# Patient Record
Sex: Male | Born: 1975 | Race: White | Hispanic: No | Marital: Single | Smoking: Former smoker
Health system: Southern US, Community
[De-identification: ages and names within clinical notes are randomized; demographics above are authoritative.]

## PROBLEM LIST (undated history)

## (undated) DIAGNOSIS — J3489 Other specified disorders of nose and nasal sinuses: Secondary | ICD-10-CM

## (undated) DIAGNOSIS — F419 Anxiety disorder, unspecified: Secondary | ICD-10-CM

## (undated) HISTORY — PX: KNEE SURGERY: SHX244

## (undated) HISTORY — PX: FINGER SURGERY: SHX640

---

## 1998-05-05 ENCOUNTER — Encounter: Payer: Self-pay | Admitting: Internal Medicine

## 1998-05-05 ENCOUNTER — Emergency Department (HOSPITAL_COMMUNITY): Admission: EM | Admit: 1998-05-05 | Discharge: 1998-05-05 | Payer: Self-pay | Admitting: Internal Medicine

## 1998-05-12 ENCOUNTER — Emergency Department (HOSPITAL_COMMUNITY): Admission: EM | Admit: 1998-05-12 | Discharge: 1998-05-12 | Payer: Self-pay | Admitting: Emergency Medicine

## 1998-07-27 ENCOUNTER — Emergency Department (HOSPITAL_COMMUNITY): Admission: EM | Admit: 1998-07-27 | Discharge: 1998-07-27 | Payer: Self-pay | Admitting: Emergency Medicine

## 1998-07-27 ENCOUNTER — Encounter: Payer: Self-pay | Admitting: Emergency Medicine

## 1998-08-16 ENCOUNTER — Emergency Department (HOSPITAL_COMMUNITY): Admission: EM | Admit: 1998-08-16 | Discharge: 1998-08-16 | Payer: Self-pay | Admitting: Internal Medicine

## 1998-09-25 ENCOUNTER — Emergency Department (HOSPITAL_COMMUNITY): Admission: EM | Admit: 1998-09-25 | Discharge: 1998-09-25 | Payer: Self-pay | Admitting: Emergency Medicine

## 1998-11-04 ENCOUNTER — Encounter: Payer: Self-pay | Admitting: Emergency Medicine

## 1998-11-04 ENCOUNTER — Emergency Department (HOSPITAL_COMMUNITY): Admission: EM | Admit: 1998-11-04 | Discharge: 1998-11-04 | Payer: Self-pay | Admitting: Emergency Medicine

## 1999-03-19 ENCOUNTER — Emergency Department (HOSPITAL_COMMUNITY): Admission: EM | Admit: 1999-03-19 | Discharge: 1999-03-19 | Payer: Self-pay | Admitting: Emergency Medicine

## 1999-04-15 ENCOUNTER — Emergency Department (HOSPITAL_COMMUNITY): Admission: EM | Admit: 1999-04-15 | Discharge: 1999-04-15 | Payer: Self-pay | Admitting: Emergency Medicine

## 1999-04-15 ENCOUNTER — Encounter: Payer: Self-pay | Admitting: Emergency Medicine

## 2001-10-18 ENCOUNTER — Emergency Department (HOSPITAL_COMMUNITY): Admission: EM | Admit: 2001-10-18 | Discharge: 2001-10-18 | Payer: Self-pay | Admitting: Emergency Medicine

## 2001-12-15 ENCOUNTER — Emergency Department (HOSPITAL_COMMUNITY): Admission: EM | Admit: 2001-12-15 | Discharge: 2001-12-16 | Payer: Self-pay | Admitting: *Deleted

## 2002-08-19 ENCOUNTER — Emergency Department (HOSPITAL_COMMUNITY): Admission: EM | Admit: 2002-08-19 | Discharge: 2002-08-19 | Payer: Self-pay | Admitting: Emergency Medicine

## 2003-01-12 ENCOUNTER — Emergency Department (HOSPITAL_COMMUNITY): Admission: AC | Admit: 2003-01-12 | Discharge: 2003-01-13 | Payer: Self-pay

## 2003-01-20 ENCOUNTER — Emergency Department (HOSPITAL_COMMUNITY): Admission: EM | Admit: 2003-01-20 | Discharge: 2003-01-20 | Payer: Self-pay | Admitting: Emergency Medicine

## 2004-08-13 ENCOUNTER — Emergency Department (HOSPITAL_COMMUNITY): Admission: EM | Admit: 2004-08-13 | Discharge: 2004-08-13 | Payer: Self-pay | Admitting: Emergency Medicine

## 2007-12-23 ENCOUNTER — Emergency Department (HOSPITAL_COMMUNITY): Admission: EM | Admit: 2007-12-23 | Discharge: 2007-12-23 | Payer: Self-pay | Admitting: Emergency Medicine

## 2008-02-16 ENCOUNTER — Emergency Department (HOSPITAL_COMMUNITY): Admission: EM | Admit: 2008-02-16 | Discharge: 2008-02-16 | Payer: Self-pay | Admitting: Emergency Medicine

## 2008-09-26 ENCOUNTER — Emergency Department (HOSPITAL_COMMUNITY): Admission: EM | Admit: 2008-09-26 | Discharge: 2008-09-26 | Payer: Self-pay | Admitting: Emergency Medicine

## 2008-11-28 ENCOUNTER — Emergency Department (HOSPITAL_COMMUNITY): Admission: EM | Admit: 2008-11-28 | Discharge: 2008-11-28 | Payer: Self-pay | Admitting: Emergency Medicine

## 2008-12-16 ENCOUNTER — Emergency Department (HOSPITAL_COMMUNITY): Admission: EM | Admit: 2008-12-16 | Discharge: 2008-12-16 | Payer: Self-pay | Admitting: Emergency Medicine

## 2009-07-06 ENCOUNTER — Emergency Department (HOSPITAL_BASED_OUTPATIENT_CLINIC_OR_DEPARTMENT_OTHER): Admission: EM | Admit: 2009-07-06 | Discharge: 2009-07-06 | Payer: Self-pay | Admitting: Emergency Medicine

## 2010-10-24 LAB — ETHANOL: Alcohol, Ethyl (B): 189 mg/dL — ABNORMAL HIGH (ref 0–10)

## 2010-10-24 LAB — CBC
MCHC: 33.9 g/dL (ref 30.0–36.0)
MCV: 91.3 fL (ref 78.0–100.0)
Platelets: 164 10*3/uL (ref 150–400)
RDW: 13.6 % (ref 11.5–15.5)
WBC: 12.8 10*3/uL — ABNORMAL HIGH (ref 4.0–10.5)

## 2010-10-24 LAB — DIFFERENTIAL
Basophils Absolute: 0 10*3/uL (ref 0.0–0.1)
Basophils Relative: 0 % (ref 0–1)
Eosinophils Absolute: 0 10*3/uL (ref 0.0–0.7)
Eosinophils Relative: 0 % (ref 0–5)
Lymphs Abs: 1.2 10*3/uL (ref 0.7–4.0)
Neutrophils Relative %: 84 % — ABNORMAL HIGH (ref 43–77)

## 2010-10-24 LAB — BASIC METABOLIC PANEL
BUN: 4 mg/dL — ABNORMAL LOW (ref 6–23)
Chloride: 111 mEq/L (ref 96–112)
Creatinine, Ser: 0.79 mg/dL (ref 0.4–1.5)
Glucose, Bld: 100 mg/dL — ABNORMAL HIGH (ref 70–99)
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
## 2011-03-04 ENCOUNTER — Emergency Department (HOSPITAL_COMMUNITY)
Admission: EM | Admit: 2011-03-04 | Discharge: 2011-03-04 | Disposition: A | Discharge status: Home / Self Care | Payer: Self-pay | Attending: Emergency Medicine | Admitting: Emergency Medicine

## 2011-03-04 ENCOUNTER — Emergency Department (HOSPITAL_COMMUNITY): Payer: Self-pay

## 2011-03-04 DIAGNOSIS — S81009A Unspecified open wound, unspecified knee, initial encounter: Secondary | ICD-10-CM | POA: Insufficient documentation

## 2011-03-04 DIAGNOSIS — IMO0002 Reserved for concepts with insufficient information to code with codable children: Secondary | ICD-10-CM | POA: Insufficient documentation

## 2011-03-04 DIAGNOSIS — M79609 Pain in unspecified limb: Secondary | ICD-10-CM | POA: Insufficient documentation

## 2011-03-04 DIAGNOSIS — S0180XA Unspecified open wound of other part of head, initial encounter: Secondary | ICD-10-CM | POA: Insufficient documentation

## 2011-09-30 ENCOUNTER — Emergency Department (INDEPENDENT_AMBULATORY_CARE_PROVIDER_SITE_OTHER): Payer: BC Managed Care – PPO

## 2011-09-30 ENCOUNTER — Encounter (HOSPITAL_BASED_OUTPATIENT_CLINIC_OR_DEPARTMENT_OTHER): Payer: Self-pay | Admitting: Emergency Medicine

## 2011-09-30 ENCOUNTER — Emergency Department (HOSPITAL_BASED_OUTPATIENT_CLINIC_OR_DEPARTMENT_OTHER)
Admission: EM | Admit: 2011-09-30 | Discharge: 2011-09-30 | Disposition: A | Discharge status: Home / Self Care | Payer: BC Managed Care – PPO | Attending: Emergency Medicine | Admitting: Emergency Medicine

## 2011-09-30 DIAGNOSIS — W208XXA Other cause of strike by thrown, projected or falling object, initial encounter: Secondary | ICD-10-CM | POA: Insufficient documentation

## 2011-09-30 DIAGNOSIS — M25539 Pain in unspecified wrist: Secondary | ICD-10-CM | POA: Insufficient documentation

## 2011-09-30 DIAGNOSIS — M7989 Other specified soft tissue disorders: Secondary | ICD-10-CM | POA: Insufficient documentation

## 2011-09-30 DIAGNOSIS — R209 Unspecified disturbances of skin sensation: Secondary | ICD-10-CM | POA: Insufficient documentation

## 2011-09-30 DIAGNOSIS — X58XXXA Exposure to other specified factors, initial encounter: Secondary | ICD-10-CM

## 2011-09-30 DIAGNOSIS — M79609 Pain in unspecified limb: Secondary | ICD-10-CM | POA: Insufficient documentation

## 2011-09-30 DIAGNOSIS — R609 Edema, unspecified: Secondary | ICD-10-CM | POA: Insufficient documentation

## 2011-09-30 DIAGNOSIS — IMO0002 Reserved for concepts with insufficient information to code with codable children: Secondary | ICD-10-CM | POA: Insufficient documentation

## 2011-09-30 DIAGNOSIS — S6990XA Unspecified injury of unspecified wrist, hand and finger(s), initial encounter: Secondary | ICD-10-CM | POA: Insufficient documentation

## 2011-09-30 DIAGNOSIS — S60229A Contusion of unspecified hand, initial encounter: Secondary | ICD-10-CM | POA: Insufficient documentation

## 2011-09-30 DIAGNOSIS — T148XXA Other injury of unspecified body region, initial encounter: Secondary | ICD-10-CM

## 2011-09-30 DIAGNOSIS — M25449 Effusion, unspecified hand: Secondary | ICD-10-CM | POA: Insufficient documentation

## 2011-09-30 HISTORY — DX: Anxiety disorder, unspecified: F41.9

## 2011-09-30 LAB — DIFFERENTIAL
Basophils Absolute: 0 10*3/uL (ref 0.0–0.1)
Basophils Relative: 0 % (ref 0–1)
Eosinophils Absolute: 0.2 K/uL (ref 0.0–0.7)
Eosinophils Relative: 3 % (ref 0–5)
Lymphocytes Relative: 30 % (ref 12–46)
Lymphs Abs: 2.2 10*3/uL (ref 0.7–4.0)
Monocytes Absolute: 0.7 K/uL (ref 0.1–1.0)
Monocytes Relative: 10 % (ref 3–12)
Neutro Abs: 4.2 10*3/uL (ref 1.7–7.7)
Neutrophils Relative %: 57 % (ref 43–77)

## 2011-09-30 LAB — CBC
HCT: 44.5 % (ref 39.0–52.0)
Hemoglobin: 15.6 g/dL (ref 13.0–17.0)
MCH: 30.8 pg (ref 26.0–34.0)
MCHC: 35.1 g/dL (ref 30.0–36.0)
MCV: 87.8 fL (ref 78.0–100.0)
Platelets: 188 10*3/uL (ref 150–400)
RBC: 5.07 MIL/uL (ref 4.22–5.81)
RDW: 13.2 % (ref 11.5–15.5)
WBC: 7.3 10*3/uL (ref 4.0–10.5)

## 2011-09-30 LAB — SEDIMENTATION RATE: Sed Rate: 0 mm/hr (ref 0–16)

## 2011-09-30 LAB — URIC ACID: Uric Acid, Serum: 5.7 mg/dL (ref 4.0–7.8)

## 2011-09-30 MED ORDER — METHYLPREDNISOLONE 4 MG PO KIT: PACK | ORAL | Status: AC

## 2011-09-30 MED ORDER — OXYCODONE HCL 5 MG PO CAPS: 5.0000 mg | ORAL_CAPSULE | ORAL | Status: AC | PRN

## 2011-09-30 NOTE — ED Notes (Signed)
Dr. Amanda Pea here to see pt.

## 2011-09-30 NOTE — ED Provider Notes (Signed)
History     CSN: [id number]  Arrival date & time 09/30/11  1025   First MD Initiated Contact with Patient 09/30/11 1105      Chief Complaint  Patient presents with  . Hand Injury    (Consider location/radiation/quality/duration/timing/severity/associated sxs/prior treatment) HPI Comments: Patient reports 2 or 3 days ago, he was trying to move his daughter is wooden bed by himself and he slipped to the ground and when he did portion of the bed fell onto the dorsum of his left hand. He reports pain was present at the time but he was able to move his hand and was not particularly swollen at the time and actually was able to try and go to work. He reports no no skin that seem immediate that at that time without any new bleeding, abrasions or lacerations. Subsequently the color of his hand has become more red and has become much more tense and swollen involving his entire hand and now spreading throughout his wrist and into his forearm. He is right-hand dominant. He denies any fevers or chills. He was seen by his primary care physician yesterday who apparently did plain films in the office, reported no fractures and was placed on Percocet for pain. He was instructed to keep his hand elevated. The patient has also been taking ibuprofen for symptoms. He also now complains of numbness to the tips of all of his fingers. He is able to move his fingers and his wrist somewhat but is limited to to the swelling which does increase his pain when he tries to flex his hand into a fist. He denies pain into his elbow or shoulder.  Patient is a 37 y.o. male presenting with hand injury. The history is provided by the patient.  Hand Injury  Pertinent negatives include no fever.    Past Medical History  Diagnosis Date  . Anxiety     History reviewed. No pertinent past surgical history.  History reviewed. No pertinent family history.  History  Substance Use Topics  . Smoking status: Never Smoker   .  Smokeless tobacco: Not on file  . Alcohol Use: Yes      Review of Systems  Constitutional: Negative.  Negative for fever and chills.  Musculoskeletal: Positive for joint swelling and arthralgias.  Skin: Positive for color change and wound. Negative for rash.  Neurological: Positive for numbness. Negative for weakness.    Allergies  Review of patient's allergies indicates no known allergies.  Home Medications   Current Outpatient Rx  Name Route Sig Dispense Refill  . OXYCODONE-ACETAMINOPHEN 10-325 MG PO TABS Oral Take 1 tablet by mouth every 4 (four) hours as needed.    . METHYLPREDNISOLONE 4 MG PO KIT  follow package directions 21 tablet 0  . OXYCODONE HCL 5 MG PO CAPS Oral Take 1 capsule (5 mg total) by mouth every 4 (four) hours as needed. 30 capsule 0    BP 131/80  Pulse 56  Temp(Src) 98 F (36.7 C) (Oral)  Resp 18  SpO2 99%  Physical Exam  Nursing note and vitals reviewed. Constitutional: He appears well-developed and well-nourished.  HENT:  Head: Normocephalic.  Musculoskeletal:       Diffuse redness and swelling involving primarily the dorsum of his left hand that extends into his wrist and forearm. There is pitting edema present but is very firm to touch. He is able to flex and extend his fingers and thumb but is unable to make a complete fist due to the  degree of swelling. There is evidence of healing old abrasions to the dorsum of his hand, but he reports that these or not recent and not associated with his recent trauma. His hand is not hot to touch and feels the same temperature as his right hand. His fingertips he reports he is able to feel pressure sensation but not painful stimuli. 2+ radial pulses palpable in the left upper extremity. Cap refill is approximately 2 seconds on his fingertips.  Neurological: He is alert. He has normal strength. GCS eye subscore is 4. GCS verbal subscore is 5. GCS motor subscore is 6.  Skin: Skin is warm.    ED Course    Procedures (including critical care time)   Labs Reviewed  CBC  DIFFERENTIAL  SEDIMENTATION RATE  URIC ACID   Dg Wrist Complete Left  09/30/2011  *RADIOLOGY REPORT*  Clinical Data: Blunt trauma  LEFT WRIST - COMPLETE 3+ VIEW  Comparison: Hand films 02/16/2008  Findings: There is a remote ulnar styloid fracture.  There is no acute fracture of distal radius or ulna.  The radiocarpal joint is normal.  Carpal bones are normal.  IMPRESSION: No left wrist fracture.  Original Report Authenticated By: Genevive Bi, M.D.   Dg Hand Complete Left  09/30/2011  *RADIOLOGY REPORT*  Clinical Data: Blunt trauma to hand  LEFT HAND - COMPLETE 3+ VIEW  Comparison: None.  Findings: There is a well corticated ossific fragment adjacent to the ulnar styloid which likely represents remote fracture.  No acute fracture of distal radius or ulna.  The radiocarpal joint appears normal.  There is significant soft tissue swelling over dorsum of the hand.  No radiodense foreign body.  IMPRESSION:  1.  No evidence acute fracture. 2.  Significant soft tissue swelling of the dorsum of the hand.  3. Remote ulnar styloid fracture.  Original Report Authenticated By: Genevive Bi, M.D.     1. Contusion     2:22 PM  Pt seen by Dr. Amanda Pea who thinks deep contusion.  Pt is splinted, told to keep elevated, can follow up with Dr. Amanda Pea as outpt.  Thinks tingling is from a carpal tunnel pressure difference from swelling.    MDM   Patient's examination suggests either traumatic injury and swelling versus infection. However he does not have a fever and his skin is not hot to touch. I discussed my findings with hand surgeon on call, Dr. Amanda Pea, who has graciously come here to the emergency department to evaluate the patient. At his request, a CBC, uric acid, sedimentation rate and plain films were ordered. These thus far appeared to be unremarkable. I did review the plain films myself. No acute fracture is noted. Soft tissue  swelling was evident which is also evident on clinical exam.        Gavin Pound. Claudine Stallings, MD 09/30/11 1422

## 2011-09-30 NOTE — Discharge Instructions (Signed)
Elevate and move your left hand/fingers frequently  Please do not engage in physical activity  Call Dr. Amanda Pea  for any problems. If your symptoms worsen please return to motion immediately and asked for your Dr.

## 2011-09-30 NOTE — ED Notes (Signed)
Pt. C.o of pain and swelling in left hand. Stated he dropped bed on it 2 days ago and went to primary MD and it wasn't broken. Hand it hot to touch and swollen upon assessment.

## 2011-09-30 NOTE — Consult Note (Signed)
Reason for Consult:Left arm pain and swelling Referring Physician: [name] is an 36 y.o. male.  HPI: Patient is a pleasant male 36 years of age who complains of left arm swelling after an injury Thursday. He had a bed fall on his left hand Thursday. He was seen by his regular physician Friday and a conservative algorithm was instituted.. There was no specific recommendations except for avoid ice and perform elevation. He presents today as he is having continued swelling and mild pain. He denies prior history of infection into the hand. He states he does have some tingling to the tips of the fingers which is developed.  He has no warmth or cellulitic findings to the arm. 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 His main issue is the contusive injury to his hand with subsequent swelling that he perceives.  Admittedly he has not elevated it as well as he should.  He denies other injury or exposure.   Past Medical History  Diagnosis Date  . Anxiety     History reviewed. No pertinent past surgical history.  History reviewed. No pertinent family history.  Social History:  reports that he has never smoked. He does not have any smokeless tobacco history on file. He reports that he drinks alcohol. He reports that he does not use illicit drugs.  Allergies: No Known Allergies  Medications: I have reviewed the patient's current medications.  Results for orders placed during the hospital encounter of 09/30/11 (from the past 48 hour(s))  CBC     Status: Normal   Collection Time   09/30/11 12:12 PM      Component Value Range Comment   WBC 7.3  4.0 - 10.5 (K/uL)    RBC 5.07  4.22 - 5.81 (MIL/uL)    Hemoglobin 15.6  13.0 - 17.0 (g/dL)    HCT 16.1  09.6 - 04.5 (%)    MCV 87.8  78.0 - 100.0 (fL)    MCH 30.8  26.0 - 34.0 (pg)    MCHC 35.1  30.0 - 36.0 (g/dL)    RDW 40.9  81.1 - 91.4 (%)    Platelets 188  150 - 400 (K/uL)   DIFFERENTIAL     Status: Normal   Collection Time   09/30/11 12:12 PM      Component Value Range Comment   Neutrophils Relative 57  43 - 77 (%)    Neutro Abs 4.2  1.7 - 7.7 (K/uL)    Lymphocytes Relative 30  12 - 46 (%)    Lymphs Abs 2.2  0.7 - 4.0 (K/uL)    Monocytes Relative 10  3 - 12 (%)    Monocytes Absolute 0.7  0.1 - 1.0 (K/uL)    Eosinophils Relative 3  0 - 5 (%)    Eosinophils Absolute 0.2  0.0 - 0.7 (K/uL)    Basophils Relative 0  0 - 1 (%)    Basophils Absolute 0.0  0.0 - 0.1 (K/uL)   URIC ACID     Status: Normal   Collection Time   09/30/11 12:12 PM      Component Value Range Comment   Uric Acid, Serum 5.7  4.0 - 7.8 (mg/dL)     Dg Wrist Complete Left  09/30/2011  *RADIOLOGY REPORT*  Clinical Data: Blunt trauma  LEFT WRIST - COMPLETE 3+ VIEW  Comparison: Hand films 02/16/2008  Findings: There  is a remote ulnar styloid fracture.  There is no acute fracture of distal radius or ulna.  The radiocarpal joint is normal.  Carpal bones are normal.  IMPRESSION: No left wrist fracture.  Original Report Authenticated By: Genevive Bi, M.D.   Dg Hand Complete Left  09/30/2011  *RADIOLOGY REPORT*  Clinical Data: Blunt trauma to hand  LEFT HAND - COMPLETE 3+ VIEW  Comparison: None.  Findings: There is a well corticated ossific fragment adjacent to the ulnar styloid which likely represents remote fracture.  No acute fracture of distal radius or ulna.  The radiocarpal joint appears normal.  There is significant soft tissue swelling over dorsum of the hand.  No radiodense foreign body.  IMPRESSION:  1.  No evidence acute fracture. 2.  Significant soft tissue swelling of the dorsum of the hand.  3. Remote ulnar styloid fracture.  Original Report Authenticated By: Genevive Bi, M.D.    Review of Systems  Constitutional: Negative.   HENT: Negative.   Eyes: Negative.   Respiratory: Negative.   Cardiovascular: Negative.   Gastrointestinal: Negative.     Genitourinary: Negative.   Musculoskeletal:       He has contusive injury to the left upper extremity and swelling  Skin:       Contusive injury to left hand and distal forearm noted with ecchymosis.  Neurological: Negative.   Endo/Heme/Allergies: Negative.   Psychiatric/Behavioral: Negative.    There were no vitals taken for this visit. Physical Exam he has a normal HEENT exam. Abdomen is nontender lower extremity examination is benign without signs of infection dystrophy vascular compromise or DVT. Patient has a normal right upper extremity with multiple tattooed markings. Patient has a left upper extremity examination which shows swelling over the hand and wrist including the distal forearm. The swelling is generalized and his compartments are all soft. He has good refill to the hand. He has good radial artery pulse. He is nontender to passive extension of the fingers. There is no evidence of compartment syndrome. There is no evidence of DVT. There is no evidence of obvious bony injury. There is no evidence of instability although the swelling makes stress testing of the wrist difficult. He does complain of some sensory disturbance in the hand at the tips of his fingers.  He does not have any advance signs of vascular or bony derangement. He did not have any signs of infection as there is no heat or warmth to the area. He certainly does not have any signs of flexor tenosynovitis in the fingers suspect he has some swelling in his carpal canal given the swelling and the slight decreased sensation to the tips of his fingers. The patient I reviewed this at length. His chest is clear his neck and back are nontender.  I reviewed all radiographs which are negative. I reviewed these personally.  Assessment/Plan: Contusive injury to the wrist and distal forearm after a bed fell on his hand. There is no evidence of advanced compartment syndrome but there is certainly swelling in the hand which makes it  uncomfortable for the patient.  I do not see any signs of infection compartment syndrome or advanced vascular derangement. I would recommend that we elevate splint and immobilized extremity. Unfortunately he has not been elevating the extremity well and this is going to be absent a necessary. I would keep a very close eye on his sensation. If he worsens I would consider nerve studies and carpal tunnel release if necessary. He understands this. Oftentimes  it is difficult to discern a evolving compression syndrome to the nerve versus a contusive injury to the nerve. I have discussed this with the patient at length and he understands these issues. I am going to place him on a Medrol Dosepak I have also splinted him with a short arm splint and discussed with him the continued elevation and range of motion measures  It is a pleasure to see him today we also have oxycodone written for pain and he understands that I will want to check him in 24 hours to make sure that he is not worsening. It is very clear to him at this point in time how important elevation and backing off his activities are per his recovery  Was a pleasure to see him today  Karen Chafe 09/30/2011, 1:50 PM

## 2011-09-30 NOTE — Discharge Summary (Signed)
  Please see physician notes/consult

## 2012-07-23 ENCOUNTER — Emergency Department (HOSPITAL_BASED_OUTPATIENT_CLINIC_OR_DEPARTMENT_OTHER): Payer: Self-pay

## 2012-07-23 ENCOUNTER — Emergency Department (HOSPITAL_BASED_OUTPATIENT_CLINIC_OR_DEPARTMENT_OTHER)
Admission: EM | Admit: 2012-07-23 | Discharge: 2012-07-23 | Disposition: A | Discharge status: Home / Self Care | Payer: Self-pay | Attending: Emergency Medicine | Admitting: Emergency Medicine

## 2012-07-23 ENCOUNTER — Encounter (HOSPITAL_BASED_OUTPATIENT_CLINIC_OR_DEPARTMENT_OTHER): Payer: Self-pay | Admitting: *Deleted

## 2012-07-23 DIAGNOSIS — M25449 Effusion, unspecified hand: Secondary | ICD-10-CM | POA: Insufficient documentation

## 2012-07-23 DIAGNOSIS — IMO0001 Reserved for inherently not codable concepts without codable children: Secondary | ICD-10-CM | POA: Insufficient documentation

## 2012-07-23 DIAGNOSIS — W208XXA Other cause of strike by thrown, projected or falling object, initial encounter: Secondary | ICD-10-CM | POA: Insufficient documentation

## 2012-07-23 DIAGNOSIS — Y9269 Other specified industrial and construction area as the place of occurrence of the external cause: Secondary | ICD-10-CM | POA: Insufficient documentation

## 2012-07-23 DIAGNOSIS — Z87891 Personal history of nicotine dependence: Secondary | ICD-10-CM | POA: Insufficient documentation

## 2012-07-23 DIAGNOSIS — Z79899 Other long term (current) drug therapy: Secondary | ICD-10-CM | POA: Insufficient documentation

## 2012-07-23 DIAGNOSIS — F411 Generalized anxiety disorder: Secondary | ICD-10-CM | POA: Insufficient documentation

## 2012-07-23 DIAGNOSIS — M25442 Effusion, left hand: Secondary | ICD-10-CM

## 2012-07-23 DIAGNOSIS — Y990 Civilian activity done for income or pay: Secondary | ICD-10-CM | POA: Insufficient documentation

## 2012-07-23 DIAGNOSIS — S6990XA Unspecified injury of unspecified wrist, hand and finger(s), initial encounter: Secondary | ICD-10-CM | POA: Insufficient documentation

## 2012-07-23 DIAGNOSIS — Z87828 Personal history of other (healed) physical injury and trauma: Secondary | ICD-10-CM | POA: Insufficient documentation

## 2012-07-23 MED ORDER — HYDROCODONE-ACETAMINOPHEN 5-325 MG PO TABS: 2.0000 | ORAL_TABLET | ORAL | Status: DC | PRN

## 2012-07-23 NOTE — ED Notes (Addendum)
States a part fell on his left hand at work this am. Hand is red and swollen. Denies IV injection into his left  hand.

## 2012-07-23 NOTE — ED Provider Notes (Signed)
Medical screening examination/treatment/procedure(s) were performed by non-physician practitioner and as supervising physician I was immediately available for consultation/collaboration.  Ethelda Chick, MD 07/23/12 2152

## 2012-07-23 NOTE — ED Provider Notes (Signed)
History     CSN: [id number]  Arrival date & time 07/23/12  1740   First MD Initiated Contact with Patient 07/23/12 1848      Chief Complaint  Patient presents with  . Hand Injury    (Consider location/radiation/quality/duration/timing/severity/associated sxs/prior treatment) Patient is a 37 y.o. male presenting with hand injury. The history is provided by the patient. No language interpreter was used.  Hand Injury  The incident occurred 6 to 12 hours ago. The incident occurred at work. The injury mechanism was a direct blow. The pain is present in the left hand. The quality of the pain is described as aching. The pain is moderate. The pain has been constant since the incident. Pertinent negatives include no fever. He reports no foreign bodies present. The symptoms are aggravated by movement. He has tried nothing for the symptoms. The treatment provided no relief.  Pt reports he injured hand about 6 months ago and saw Dr. Amanda Pea,  No fracture.  Pt reports he had swelling that went down.  Pt reports today he dropped a part on hand and he heard a pop,  Now hand is swollen like before  Past Medical History  Diagnosis Date  . Anxiety     History reviewed. No pertinent past surgical history.  No family history on file.  History  Substance Use Topics  . Smoking status: Former Games developer  . Smokeless tobacco: Not on file  . Alcohol Use: Yes      Review of Systems  Constitutional: Negative for fever.  Musculoskeletal: Positive for myalgias and joint swelling.  All other systems reviewed and are negative.    Allergies  Review of patient's allergies indicates no known allergies.  Home Medications   Current Outpatient Rx  Name  Route  Sig  Dispense  Refill  . ALPRAZOLAM PO   Oral   Take by mouth.         . OXYCODONE-ACETAMINOPHEN 10-325 MG PO TABS   Oral   Take 1 tablet by mouth every 4 (four) hours as needed.           BP 140/77  Pulse 78  Temp 98.5 F (36.9 C)  (Oral)  Resp 20  SpO2 100%  Physical Exam  Nursing note and vitals reviewed. Constitutional: He is oriented to person, place, and time. He appears well-developed and well-nourished.  Musculoskeletal: He exhibits tenderness.       Swollen left hand,  From,   nv and ns intact,  No erythema,  Same temp to palp bilat,  nv and ns intact  Neurological: He is alert and oriented to person, place, and time. He has normal reflexes.  Psychiatric: He has a normal mood and affect.    ED Course  Procedures (including critical care time)  Labs Reviewed - No data to display Dg Hand Complete Left  07/23/2012  *RADIOLOGY REPORT*  Clinical Data: Hand injury.  Hand pain.  Swelling and tenderness.  LEFT HAND - COMPLETE 3+ VIEW  Comparison: 09/30/2011.  Findings: Anatomic alignment of the bones of the right hand.  Old ulnar styloid avulsion fracture.  There is no acute fracture identified.  Soft tissue swelling is present over the dorsum of the hand which appears similar to 09/30/2011.  Soft tissue swelling extends over the dorsum of the wrist and distal forearm.  IMPRESSION: Diffuse dorsal soft tissue swelling without osseous injury.   Original Report Authenticated By: Andreas Newport, M.D.      1. Swelling of joint of left  hand       MDM  I counseled pt.  I will treat pain,  Pt placed in ace and sling,   I advised elevate,  Call Dr. Amanda Pea to be seen.   I suspect ligamentous injury        Elson Areas, PA 07/23/12 2150  Lonia Skinner [place], [place] 07/23/12 2150

## 2013-03-05 ENCOUNTER — Emergency Department (HOSPITAL_BASED_OUTPATIENT_CLINIC_OR_DEPARTMENT_OTHER)
Admission: EM | Admit: 2013-03-05 | Discharge: 2013-03-05 | Disposition: A | Discharge status: Home / Self Care | Payer: Self-pay | Attending: Emergency Medicine | Admitting: Emergency Medicine

## 2013-03-05 ENCOUNTER — Encounter (HOSPITAL_BASED_OUTPATIENT_CLINIC_OR_DEPARTMENT_OTHER): Payer: Self-pay

## 2013-03-05 DIAGNOSIS — R51 Headache: Secondary | ICD-10-CM | POA: Insufficient documentation

## 2013-03-05 DIAGNOSIS — Z87891 Personal history of nicotine dependence: Secondary | ICD-10-CM | POA: Insufficient documentation

## 2013-03-05 DIAGNOSIS — R059 Cough, unspecified: Secondary | ICD-10-CM | POA: Insufficient documentation

## 2013-03-05 DIAGNOSIS — R05 Cough: Secondary | ICD-10-CM | POA: Insufficient documentation

## 2013-03-05 DIAGNOSIS — J329 Chronic sinusitis, unspecified: Secondary | ICD-10-CM | POA: Insufficient documentation

## 2013-03-05 DIAGNOSIS — F411 Generalized anxiety disorder: Secondary | ICD-10-CM | POA: Insufficient documentation

## 2013-03-05 DIAGNOSIS — Z79899 Other long term (current) drug therapy: Secondary | ICD-10-CM | POA: Insufficient documentation

## 2013-03-05 MED ORDER — AMOXICILLIN 500 MG PO CAPS: 500.0000 mg | ORAL_CAPSULE | Freq: Three times a day (TID) | ORAL | Status: DC

## 2013-03-05 NOTE — ED Notes (Signed)
Pt reports head and nasal congestion x 1 week.  Denies fever or other symptoms.

## 2013-03-05 NOTE — ED Provider Notes (Signed)
  CSN: [id number]     Arrival date & time 03/05/13  1209 History     First MD Initiated Contact with Patient 03/05/13 1218     Chief Complaint  Patient presents with  . Nasal Congestion  . Facial Pain   (Consider location/radiation/quality/duration/timing/severity/associated sxs/prior Treatment) Patient is a 37 y.o. male presenting with cough. The history is provided by the patient. No language interpreter was used.  Cough Cough characteristics:  Non-productive Severity:  Moderate Timing:  Constant Progression:  Worsening Chronicity:  New Relieved by:  Nothing Worsened by:  Nothing tried Associated symptoms: rhinorrhea     Past Medical History  Diagnosis Date  . Anxiety    History reviewed. No pertinent past surgical history. No family history on file. History  Substance Use Topics  . Smoking status: Former Games developer  . Smokeless tobacco: Not on file  . Alcohol Use: Yes    Review of Systems  HENT: Positive for congestion, rhinorrhea and sinus pressure.   Respiratory: Positive for cough.   All other systems reviewed and are negative.    Allergies  Review of patient's allergies indicates no known allergies.  Home Medications   Current Outpatient Rx  Name  Route  Sig  Dispense  Refill  . ALPRAZOLAM PO   Oral   Take by mouth.         Marland Kitchen HYDROcodone-acetaminophen (NORCO/VICODIN) 5-325 MG per tablet   Oral   Take 2 tablets by mouth every 4 (four) hours as needed for pain.   10 tablet   0    BP 135/82  Pulse 99  Temp(Src) 98.4 F (36.9 C) (Oral)  Resp 16  Ht 5\' 11"  (1.803 m)  Wt 155 lb (70.308 kg)  BMI 21.63 kg/m2  SpO2 99% Physical Exam  Nursing note and vitals reviewed. Constitutional: He is oriented to person, place, and time. He appears well-developed and well-nourished.  HENT:  Head: Normocephalic.  Right Ear: External ear normal.  Left Ear: External ear normal.  Nose: Nose normal.  Mouth/Throat: Oropharynx is clear and moist.  Eyes:  Conjunctivae are normal. Pupils are equal, round, and reactive to light.  Neck: Normal range of motion. Neck supple.  Cardiovascular: Normal rate.   Pulmonary/Chest: Effort normal.  Abdominal: Soft.  Musculoskeletal: Normal range of motion.  Neurological: He is alert and oriented to person, place, and time.  Skin: Skin is warm.  Psychiatric: He has a normal mood and affect.    ED Course   Procedures (including critical care time)  Labs Reviewed - No data to display No results found. 1. Sinusitis     MDM    Elson Areas, PA-C 03/05/13 1245

## 2013-03-05 NOTE — ED Notes (Signed)
Pt reports facial pain and head congestion x 1 week.

## 2013-03-06 NOTE — ED Provider Notes (Signed)
Medical screening examination/treatment/procedure(s) were performed by non-physician practitioner and as supervising physician I was immediately available for consultation/collaboration.   Audree Camel, MD 03/06/13 [phone]

## 2013-04-27 ENCOUNTER — Emergency Department (HOSPITAL_BASED_OUTPATIENT_CLINIC_OR_DEPARTMENT_OTHER)
Admission: EM | Admit: 2013-04-27 | Discharge: 2013-04-27 | Disposition: A | Discharge status: Home / Self Care | Payer: 59 | Attending: Emergency Medicine | Admitting: Emergency Medicine

## 2013-04-27 ENCOUNTER — Encounter (HOSPITAL_BASED_OUTPATIENT_CLINIC_OR_DEPARTMENT_OTHER): Payer: Self-pay | Admitting: Emergency Medicine

## 2013-04-27 DIAGNOSIS — Z792 Long term (current) use of antibiotics: Secondary | ICD-10-CM | POA: Insufficient documentation

## 2013-04-27 DIAGNOSIS — W268XXA Contact with other sharp object(s), not elsewhere classified, initial encounter: Secondary | ICD-10-CM | POA: Insufficient documentation

## 2013-04-27 DIAGNOSIS — Y929 Unspecified place or not applicable: Secondary | ICD-10-CM | POA: Insufficient documentation

## 2013-04-27 DIAGNOSIS — F17200 Nicotine dependence, unspecified, uncomplicated: Secondary | ICD-10-CM | POA: Insufficient documentation

## 2013-04-27 DIAGNOSIS — S01309A Unspecified open wound of unspecified ear, initial encounter: Secondary | ICD-10-CM | POA: Insufficient documentation

## 2013-04-27 DIAGNOSIS — F411 Generalized anxiety disorder: Secondary | ICD-10-CM | POA: Insufficient documentation

## 2013-04-27 DIAGNOSIS — S01312A Laceration without foreign body of left ear, initial encounter: Secondary | ICD-10-CM

## 2013-04-27 DIAGNOSIS — Y9389 Activity, other specified: Secondary | ICD-10-CM | POA: Insufficient documentation

## 2013-04-27 DIAGNOSIS — Z79899 Other long term (current) drug therapy: Secondary | ICD-10-CM | POA: Insufficient documentation

## 2013-04-27 NOTE — ED Provider Notes (Signed)
CSN: [id number]     Arrival date & time 04/27/13  0014 History   First MD Initiated Contact with Patient 04/27/13 0107     Chief Complaint  Patient presents with  . Ear Injury   (Consider location/radiation/quality/duration/timing/severity/associated sxs/prior Treatment) Patient is a 37 y.o. male presenting with ear pain. The history is provided by the patient.  Otalgia Location:  Left Behind ear:  No abnormality (abnormality, ie tear of lower part of the ear lobe from trying to insert a "gauge") Quality:  Aching Severity:  Mild Onset quality:  Sudden Duration:  1 hour Timing:  Constant Progression:  Unchanged Chronicity:  New Context comment:  Tear Relieved by:  Nothing Worsened by:  Nothing tried Ineffective treatments:  None tried Associated symptoms: no abdominal pain   Risk factors: no recent travel     Past Medical History  Diagnosis Date  . Anxiety    History reviewed. No pertinent past surgical history. History reviewed. No pertinent family history. History  Substance Use Topics  . Smoking status: Current Every Day Smoker  . Smokeless tobacco: Not on file  . Alcohol Use: Yes    Review of Systems  HENT: Positive for ear pain.   Gastrointestinal: Negative for abdominal pain.  All other systems reviewed and are negative.    Allergies  Review of patient's allergies indicates no known allergies.  Home Medications   Current Outpatient Rx  Name  Route  Sig  Dispense  Refill  . ALPRAZOLAM PO   Oral   Take by mouth.         Marland Kitchen amoxicillin (AMOXIL) 500 MG capsule   Oral   Take 1 capsule (500 mg total) by mouth 3 (three) times daily.   30 capsule   0   . HYDROcodone-acetaminophen (NORCO/VICODIN) 5-325 MG per tablet   Oral   Take 2 tablets by mouth every 4 (four) hours as needed for pain.   10 tablet   0    BP 137/73  Pulse 58  Temp(Src) 97.8 F (36.6 C) (Oral)  Resp 16  Ht 5\' 10"  (1.778 m)  Wt 165 lb (74.844 kg)  BMI 23.68 kg/m2  SpO2  100% Physical Exam  Constitutional: He is oriented to person, place, and time. He appears well-developed and well-nourished.  HENT:  Head: Normocephalic and atraumatic.    Mouth/Throat: Oropharynx is clear and moist.  Tear of the lobule of the left ear   Eyes: Conjunctivae are normal. Pupils are equal, round, and reactive to light.  Neck: Normal range of motion. Neck supple.  Cardiovascular: Normal rate, regular rhythm and intact distal pulses.   Pulmonary/Chest: Effort normal and breath sounds normal. He has no wheezes. He has no rales.  Abdominal: Soft. Bowel sounds are normal. There is no tenderness.  Musculoskeletal: Normal range of motion.  Neurological: He is alert and oriented to person, place, and time.  Skin: Skin is warm and dry.  Psychiatric: He has a normal mood and affect.    ED Course  Procedures (including critical care time) Labs Review Labs Reviewed - No data to display Imaging Review No results found.  EKG Interpretation   None       MDM  No diagnosis found. Patient counseled that suturing may not be permanent due to necrosis of tissue caused by the "gauge" earring. EDP cannot suture shut hole  Patient advised he will definitely need to follow up with ear specialist for definitive care.  Patient and wife verbalized understanding and agree to  follow up    LACERATION REPAIR Performed by: Jasmine Awe Authorized by: Jasmine Awe Consent: Verbal consent obtained. Risks and benefits: risks, benefits and alternatives were discussed Consent given by: patient Patient identity confirmed: provided demographic data Prepped and Draped in normal sterile fashion Wound explored  Laceration Location: left ear lobe  Laceration Length: 0.2 cm No Foreign Bodies seen or palpated  Anesthesia:patient refused anesthesia   Irrigation method: syringe Amount of cleaning: standard  Skin closure: 4.0 vicryl   Number of sutures:2   Technique:  interrupted  Patient tolerance: Patient tolerated the procedure well with no immediate complications.   Jasmine Awe, MD 04/27/13 [phone]

## 2013-04-27 NOTE — ED Notes (Signed)
D/c home with family- no new rx given 

## 2013-04-27 NOTE — ED Notes (Addendum)
Patient was stretching his left ear and it ripped.

## 2013-04-27 NOTE — ED Notes (Signed)
MD at bedside suturing pt.

## 2013-10-24 ENCOUNTER — Emergency Department (HOSPITAL_BASED_OUTPATIENT_CLINIC_OR_DEPARTMENT_OTHER)
Admission: EM | Admit: 2013-10-24 | Discharge: 2013-10-24 | Disposition: A | Discharge status: Home / Self Care | Payer: 59 | Attending: Emergency Medicine | Admitting: Emergency Medicine

## 2013-10-24 ENCOUNTER — Encounter (HOSPITAL_BASED_OUTPATIENT_CLINIC_OR_DEPARTMENT_OTHER): Payer: Self-pay | Admitting: Emergency Medicine

## 2013-10-24 DIAGNOSIS — W268XXA Contact with other sharp object(s), not elsewhere classified, initial encounter: Secondary | ICD-10-CM | POA: Insufficient documentation

## 2013-10-24 DIAGNOSIS — R51 Headache: Secondary | ICD-10-CM | POA: Insufficient documentation

## 2013-10-24 DIAGNOSIS — S81819A Laceration without foreign body, unspecified lower leg, initial encounter: Secondary | ICD-10-CM

## 2013-10-24 DIAGNOSIS — Z792 Long term (current) use of antibiotics: Secondary | ICD-10-CM | POA: Insufficient documentation

## 2013-10-24 DIAGNOSIS — S81009A Unspecified open wound, unspecified knee, initial encounter: Secondary | ICD-10-CM | POA: Insufficient documentation

## 2013-10-24 DIAGNOSIS — Z87891 Personal history of nicotine dependence: Secondary | ICD-10-CM | POA: Insufficient documentation

## 2013-10-24 DIAGNOSIS — S91009A Unspecified open wound, unspecified ankle, initial encounter: Principal | ICD-10-CM

## 2013-10-24 DIAGNOSIS — Y929 Unspecified place or not applicable: Secondary | ICD-10-CM | POA: Insufficient documentation

## 2013-10-24 DIAGNOSIS — Y939 Activity, unspecified: Secondary | ICD-10-CM | POA: Insufficient documentation

## 2013-10-24 DIAGNOSIS — F411 Generalized anxiety disorder: Secondary | ICD-10-CM | POA: Insufficient documentation

## 2013-10-24 DIAGNOSIS — S81809A Unspecified open wound, unspecified lower leg, initial encounter: Principal | ICD-10-CM

## 2013-10-24 NOTE — ED Notes (Signed)
laceration to his right lower leg on a piece of sheet metal. Bleeding controlled.

## 2013-10-24 NOTE — ED Provider Notes (Signed)
CSN: [id number]     Arrival date & time 10/24/13  2056 History  This chart was scribed for Celene Kras, MD by Beverly Milch, ED Scribe. This patient was seen in room MH04/MH04 and the patient's care was started at 10:20 PM.    Chief Complaint  Patient presents with  . Extremity Laceration     The history is provided by the patient. No language interpreter was used.   HPI Comments: Frank Brady is a 38 y.o. male who presents to the Emergency Department complaining of laceration to his right lower leg on a piece of sheet metal today. Frank Brady states Frank Brady has an associated headache from the pain. Pt denies any numbness or weakness. Pt reports his last tetanus shot was 8 years ago. Frank Brady states Frank Brady would prefer not to have any numbing agent for staples.    Past Medical History  Diagnosis Date  . Anxiety     Past Surgical History  Procedure Laterality Date  . Knee surgery      No family history on file. History  Substance Use Topics  . Smoking status: Former Smoker    Quit date: 06/17/2013  . Smokeless tobacco: Not on file  . Alcohol Use: Yes    Review of Systems  Musculoskeletal:       Laceration to lower right leg  All other systems reviewed and are negative.     Allergies  Review of patient's allergies indicates no known allergies.  Home Medications   Current Outpatient Rx  Name  Route  Sig  Dispense  Refill  . ALPRAZOLAM PO   Oral   Take by mouth.         Marland Kitchen amoxicillin (AMOXIL) 500 MG capsule   Oral   Take 1 capsule (500 mg total) by mouth 3 (three) times daily.   30 capsule   0   . HYDROcodone-acetaminophen (NORCO/VICODIN) 5-325 MG per tablet   Oral   Take 2 tablets by mouth every 4 (four) hours as needed for pain.   10 tablet   0     Triage Vitals: BP 134/94  Pulse 80  Temp(Src) 98.3 F (36.8 C) (Oral)  Resp 20  Ht 6' (1.829 m)  Wt 165 lb (74.844 kg)  BMI 22.37 kg/m2  SpO2 100%   Physical Exam  Nursing note and vitals  reviewed. Constitutional: Frank Brady appears well-developed and well-nourished. No distress.  HENT:  Head: Normocephalic and atraumatic.  Right Ear: External ear normal.  Left Ear: External ear normal.  Eyes: Conjunctivae are normal. Right eye exhibits no discharge. Left eye exhibits no discharge. No scleral icterus.  Neck: Neck supple. No tracheal deviation present.  Cardiovascular: Normal rate.   Pulmonary/Chest: Effort normal. No stridor. No respiratory distress.  Musculoskeletal: Frank Brady exhibits no edema.       Right knee: Frank Brady exhibits laceration.       Legs: Neurological: Frank Brady is alert. Cranial nerve deficit: no gross deficits.  Skin: Skin is warm and dry. No rash noted.  Psychiatric: Frank Brady has a normal mood and affect.    ED Course  Procedures (including critical care time)  DIAGNOSTIC STUDIES: Oxygen Saturation is 100% on RA, normal by my interpretation.    COORDINATION OF CARE: 10:40 PM-  Pt advised of plan for treatment and pt agrees.  Pt does not want any anesthetic injections.  Frank Brady has had staples before.  LACERATION REPAIR Performed by: Celene Kras, MD Consent: Verbal consent obtained. Risks and benefits: risks, benefits  and alternatives were discussed Patient identity confirmed: provided demographic data Time out performed prior to procedure Prepped and Draped in normal sterile fashion Wound explored. Subcutaneous tissues no tendon and nerve involvement. Laceration Location: lateral aspect of right lower leg Laceration Length: 10 cm No Foreign Bodies seen or palpated Anesthesia: None used Local anesthetic: N/A Anesthetic total: N/A Irrigation method: syringe Amount of cleaning: standard Skin closure: staples   Number of sutures or staples: 18 Patient tolerance: Patient tolerated the procedure well with no immediate complications.   Final diagnoses:  Laceration of leg    I personally performed the services described in this documentation, which was scribed in my presence.   The recorded information has been reviewed and is accurate.    Celene KrasJon R Hannibal Skalla, MD 10/24/13 [phone]

## 2013-10-24 NOTE — Discharge Instructions (Signed)

## 2014-01-27 ENCOUNTER — Emergency Department (HOSPITAL_BASED_OUTPATIENT_CLINIC_OR_DEPARTMENT_OTHER): Payer: 59

## 2014-01-27 ENCOUNTER — Emergency Department (HOSPITAL_BASED_OUTPATIENT_CLINIC_OR_DEPARTMENT_OTHER)
Admission: EM | Admit: 2014-01-27 | Discharge: 2014-01-27 | Disposition: A | Discharge status: Home / Self Care | Payer: 59 | Attending: Emergency Medicine | Admitting: Emergency Medicine

## 2014-01-27 ENCOUNTER — Encounter (HOSPITAL_BASED_OUTPATIENT_CLINIC_OR_DEPARTMENT_OTHER): Payer: Self-pay | Admitting: Emergency Medicine

## 2014-01-27 DIAGNOSIS — Y9289 Other specified places as the place of occurrence of the external cause: Secondary | ICD-10-CM | POA: Insufficient documentation

## 2014-01-27 DIAGNOSIS — S59909A Unspecified injury of unspecified elbow, initial encounter: Secondary | ICD-10-CM | POA: Insufficient documentation

## 2014-01-27 DIAGNOSIS — X500XXA Overexertion from strenuous movement or load, initial encounter: Secondary | ICD-10-CM | POA: Insufficient documentation

## 2014-01-27 DIAGNOSIS — Y9389 Activity, other specified: Secondary | ICD-10-CM | POA: Insufficient documentation

## 2014-01-27 DIAGNOSIS — F411 Generalized anxiety disorder: Secondary | ICD-10-CM | POA: Insufficient documentation

## 2014-01-27 DIAGNOSIS — Z792 Long term (current) use of antibiotics: Secondary | ICD-10-CM | POA: Insufficient documentation

## 2014-01-27 DIAGNOSIS — M25532 Pain in left wrist: Secondary | ICD-10-CM

## 2014-01-27 DIAGNOSIS — M7989 Other specified soft tissue disorders: Secondary | ICD-10-CM

## 2014-01-27 DIAGNOSIS — Z87891 Personal history of nicotine dependence: Secondary | ICD-10-CM | POA: Insufficient documentation

## 2014-01-27 DIAGNOSIS — S59919A Unspecified injury of unspecified forearm, initial encounter: Principal | ICD-10-CM

## 2014-01-27 DIAGNOSIS — S6990XA Unspecified injury of unspecified wrist, hand and finger(s), initial encounter: Principal | ICD-10-CM

## 2014-01-27 MED ORDER — CLINDAMYCIN HCL 300 MG PO CAPS: 300.0000 mg | ORAL_CAPSULE | Freq: Four times a day (QID) | ORAL | Status: DC

## 2014-01-27 MED ORDER — TETANUS-DIPHTH-ACELL PERTUSSIS 5-2.5-18.5 LF-MCG/0.5 IM SUSP
0.5000 mL | Freq: Once | INTRAMUSCULAR | Status: DC
  Filled 2014-01-27: qty 0.5

## 2014-01-27 MED ORDER — OXYCODONE-ACETAMINOPHEN 5-325 MG PO TABS
2.0000 | ORAL_TABLET | Freq: Once | ORAL | Status: AC
  Administered 2014-01-27: 2 via ORAL
  Filled 2014-01-27: qty 2

## 2014-01-27 MED ORDER — HYDROCODONE-ACETAMINOPHEN 5-325 MG PO TABS: 1.0000 | ORAL_TABLET | Freq: Four times a day (QID) | ORAL | Status: DC | PRN

## 2014-01-27 MED ORDER — CLINDAMYCIN HCL 150 MG PO CAPS
300.0000 mg | ORAL_CAPSULE | Freq: Once | ORAL | Status: AC
  Administered 2014-01-27: 300 mg via ORAL
  Filled 2014-01-27: qty 2

## 2014-01-27 NOTE — ED Notes (Signed)
MD at bedside. 

## 2014-01-27 NOTE — ED Notes (Signed)
Patient transported to X-ray 

## 2014-01-27 NOTE — ED Provider Notes (Signed)
At discharge patient reported that Dr. Amanda PeaGramig has had to drain that hand in the past.  Patient cannot say why  Frank Brady K Aubery Douthat-Rasch, MD 01/27/14 [phone]

## 2014-01-27 NOTE — ED Notes (Signed)
Pt reports that he heard a pop in his left wrist and it started to swell immediately, previous problems with same wrist. Pt unsure what the cause of his other problems were from

## 2014-01-27 NOTE — ED Notes (Signed)
Left arm elevated on pillow on overbed table. Left hand higher than heart.  Ice pack applied to left hand.

## 2014-01-27 NOTE — ED Provider Notes (Addendum)
CSN: [id number]     Arrival date & time 01/27/14  0025 History  This chart was scribed for Efrat Zuidema Smitty CordsK Clearnce Leja-Rasch, MD by Quintella ReichertMatthew Underwood, ED scribe.  This patient was seen in room MH01/MH01 and the patient's care was started at 12:44 AM.   Chief Complaint  Patient presents with  . Wrist Injury    Patient is a 38 y.o. male presenting with wrist pain. The history is provided by the patient. No language interpreter was used.  Wrist Pain This is a new problem. The current episode started 1 to 2 hours ago. The problem occurs constantly. Progression since onset: Still present. Pertinent negatives include no headaches. Associated symptoms comments: Swelling. Exacerbated by: movement. Nothing relieves the symptoms. Treatments tried: ibuprofen. The treatment provided no relief.    HPI Comments: Frank Brady is a 38 y.o. male who presents to the Emergency Department complaining of sudden-onset left wrist pain that began 1 hour ago.  Pt states he was moving chairs out of a swimming pool when he felt a "pop" in his left wrist/ dorsum of the left hand and he immediately noticed the dorsum of L hand began to swell. Since then he has had constant moderate pain to the top of that wrist.  He has used ibuprofen.  He is not on blood-thinners and denies problems with bleeding or clotting.  When asked about the 2 scabs noted at the base of the thumb and hand patient states those are older and from work related injuries.     Past Medical History  Diagnosis Date  . Anxiety     Past Surgical History  Procedure Laterality Date  . Knee surgery      History reviewed. No pertinent family history.   History  Substance Use Topics  . Smoking status: Former Smoker    Quit date: 06/17/2013  . Smokeless tobacco: Not on file  . Alcohol Use: Yes     Review of Systems  Constitutional: Negative for fever.  Musculoskeletal: Positive for arthralgias (left wrist).  Neurological: Negative for weakness, numbness and  headaches.  All other systems reviewed and are negative.     Allergies  Review of patient's allergies indicates no known allergies.  Home Medications   Prior to Admission medications   Medication Sig Start Date End Date Taking? Authorizing Provider  ALPRAZOLAM PO Take by mouth.   Yes Historical Provider, MD  amoxicillin (AMOXIL) 500 MG capsule Take 1 capsule (500 mg total) by mouth 3 (three) times daily. 03/05/13   Elson AreasLeslie K Sofia, PA-C  HYDROcodone-acetaminophen (NORCO/VICODIN) 5-325 MG per tablet Take 2 tablets by mouth every 4 (four) hours as needed for pain. 07/23/12   Elson AreasLeslie K Sofia, PA-C   BP 125/66  Pulse 52  Temp(Src) 97.8 F (36.6 C)  Resp 20  Ht 6' (1.829 m)  Wt 165 lb (74.844 kg)  BMI 22.37 kg/m2  SpO2 99%  Physical Exam  Nursing note and vitals reviewed. Constitutional: He is oriented to person, place, and time. He appears well-developed and well-nourished. No distress.  HENT:  Head: Normocephalic and atraumatic.  Mouth/Throat: Oropharynx is clear and moist and mucous membranes are normal. No oropharyngeal exudate.  Eyes: Conjunctivae and EOM are normal. Pupils are equal, round, and reactive to light.  Neck: Normal range of motion. Neck supple. No tracheal deviation present.  Cardiovascular: Normal rate and regular rhythm.   Pulmonary/Chest: Effort normal and breath sounds normal. No respiratory distress. He has no wheezes. He has no rales.  Abdominal:  Soft. Bowel sounds are normal. There is no tenderness. There is no rebound and no guarding.  Musculoskeletal: Normal range of motion.  Radial pulse intact to left wrist.  No snuff box tenderness.  No signs of compartment syndrome, CR<2 seconds to all digits of the hand. Swelling to dorsum of left hand.  2 scabs on dorsum of hand.  Dorsum of hand is boggy and  warm.  Sensation intact, motor intact to all nerve districutions.    Neurological: He is alert and oriented to person, place, and time. He has normal reflexes. He  displays normal reflexes.  Skin: Skin is warm and dry.  Psychiatric: He has a normal mood and affect. His behavior is normal.    ED Course  Procedures (including critical care time)  DIAGNOSTIC STUDIES: Oxygen Saturation is 99% on room air, normal by my interpretation.    COORDINATION OF CARE: 12:47 AM-Discussed treatment plan which includes pain medication and x-ray with pt at bedside and pt agreed to plan.     Labs Review Labs Reviewed - No data to display  Imaging Review No results found.   EKG Interpretation None      MDM   Final diagnoses:  None   Tetanus UTD.   Upon review of the records patient has been seen in the past for the exact same thing and was supposed have followed up with Dr. Amanda Pea.    Has FROM of all digits.  Good strength, and is able to move all digits.  Doubt hematoma as swelling is not hard but boggy and it is very warm to the touch.   Boggy swelling with warmth is more consistent with infection than injury.  Suspect infection has been going on longer than 1 hour. Scabs patient reports are from work injuries several days ago.  Will place on antibiotics and have informed patient it is imperative that he follow up with hand surgery.  Return immediately for streaking up arm, fevers > 101, hand turns numb or blue or white.  Splinted and recommends elevation.  Call hand surgery in am to be seen.  Patient and family member verbalize understanding and agree to follow up    I personally performed the services described in this documentation, which was scribed in my presence. The recorded information has been reviewed and is accurate.      Jasmine Awe, MD 01/27/14 0146  Kinzlie Harney K Trygve Thal-Rasch, MD 01/27/14 [phone]

## 2014-02-14 ENCOUNTER — Ambulatory Visit (HOSPITAL_COMMUNITY)
Admission: RE | Admit: 2014-02-14 | Discharge: 2014-02-14 | Disposition: A | Discharge status: Home / Self Care | Payer: 59 | Source: Ambulatory Visit | Attending: Orthopedic Surgery | Admitting: Orthopedic Surgery

## 2014-02-14 ENCOUNTER — Other Ambulatory Visit (HOSPITAL_COMMUNITY): Payer: Self-pay | Admitting: Orthopedic Surgery

## 2014-02-14 ENCOUNTER — Other Ambulatory Visit (HOSPITAL_COMMUNITY): Payer: Self-pay | Admitting: Specialist

## 2014-02-14 DIAGNOSIS — M795 Residual foreign body in soft tissue: Secondary | ICD-10-CM | POA: Diagnosis present

## 2014-02-14 DIAGNOSIS — M79609 Pain in unspecified limb: Secondary | ICD-10-CM | POA: Diagnosis not present

## 2014-02-14 DIAGNOSIS — R52 Pain, unspecified: Secondary | ICD-10-CM

## 2014-02-14 DIAGNOSIS — Z77018 Contact with and (suspected) exposure to other hazardous metals: Secondary | ICD-10-CM | POA: Diagnosis not present

## 2014-02-17 ENCOUNTER — Encounter (HOSPITAL_BASED_OUTPATIENT_CLINIC_OR_DEPARTMENT_OTHER): Payer: Self-pay | Admitting: Emergency Medicine

## 2014-02-17 ENCOUNTER — Emergency Department (HOSPITAL_BASED_OUTPATIENT_CLINIC_OR_DEPARTMENT_OTHER)
Admission: EM | Admit: 2014-02-17 | Discharge: 2014-02-17 | Disposition: A | Discharge status: Home / Self Care | Payer: 59 | Attending: Emergency Medicine | Admitting: Emergency Medicine

## 2014-02-17 DIAGNOSIS — Z792 Long term (current) use of antibiotics: Secondary | ICD-10-CM | POA: Diagnosis not present

## 2014-02-17 DIAGNOSIS — J320 Chronic maxillary sinusitis: Secondary | ICD-10-CM | POA: Diagnosis not present

## 2014-02-17 DIAGNOSIS — F411 Generalized anxiety disorder: Secondary | ICD-10-CM | POA: Diagnosis not present

## 2014-02-17 DIAGNOSIS — F17200 Nicotine dependence, unspecified, uncomplicated: Secondary | ICD-10-CM | POA: Insufficient documentation

## 2014-02-17 DIAGNOSIS — R059 Cough, unspecified: Secondary | ICD-10-CM | POA: Insufficient documentation

## 2014-02-17 DIAGNOSIS — R05 Cough: Secondary | ICD-10-CM | POA: Insufficient documentation

## 2014-02-17 DIAGNOSIS — J0101 Acute recurrent maxillary sinusitis: Secondary | ICD-10-CM

## 2014-02-17 HISTORY — DX: Other specified disorders of nose and nasal sinuses: J34.89

## 2014-02-17 MED ORDER — AZITHROMYCIN 250 MG PO TABS: ORAL_TABLET | ORAL | Status: DC

## 2014-02-17 MED ORDER — DEXAMETHASONE SODIUM PHOSPHATE 10 MG/ML IJ SOLN
10.0000 mg | Freq: Once | INTRAMUSCULAR | Status: AC
  Administered 2014-02-17: 10 mg via INTRAMUSCULAR
  Filled 2014-02-17: qty 1

## 2014-02-17 NOTE — ED Provider Notes (Signed)
CSN: [id number]     Arrival date & time 02/17/14  1153 History   First MD Initiated Contact with Patient 02/17/14 1212     Chief Complaint  Patient presents with  . Cough     (Consider location/radiation/quality/duration/timing/severity/associated sxs/prior Treatment) HPI Comments: Patient is a 38 year old male with history of recurrent sinusitis. Presents with a one-week history of sinus congestion and pain. States he is coughing up yellow sputum. He states that his eyes hurt when he looks side to side. This is typical of a sinus infection.  Patient is a 38 y.o. male presenting with cough. The history is provided by the patient.  Cough Cough characteristics:  Productive Sputum characteristics:  Yellow Severity:  Moderate Onset quality:  Gradual Duration:  1 week Timing:  Constant Progression:  Worsening Chronicity:  Recurrent Smoker: yes   Relieved by:  Nothing Worsened by:  Nothing tried   Past Medical History  Diagnosis Date  . Anxiety   . Sinus pressure    Past Surgical History  Procedure Laterality Date  . Knee surgery    . Finger surgery     No family history on file. History  Substance Use Topics  . Smoking status: Current Every Day Smoker    Types: Cigarettes    Last Attempt to Quit: 06/17/2013  . Smokeless tobacco: Not on file  . Alcohol Use: Yes     Comment: 1-2 x month    Review of Systems  Respiratory: Positive for cough.   All other systems reviewed and are negative.     Allergies  Review of patient's allergies indicates no known allergies.  Home Medications   Prior to Admission medications   Medication Sig Start Date End Date Taking? Authorizing Provider  ALPRAZOLAM PO Take by mouth.    Historical Provider, MD  amoxicillin (AMOXIL) 500 MG capsule Take 1 capsule (500 mg total) by mouth 3 (three) times daily. 03/05/13   Elson Areas, PA-C  clindamycin (CLEOCIN) 300 MG capsule Take 1 capsule (300 mg total) by mouth 4 (four) times daily. X 7  days 01/27/14   April K Palumbo-Rasch, MD  HYDROcodone-acetaminophen [facility]) 5-325 MG per tablet Take 1 tablet by mouth every 6 (six) hours as needed. 01/27/14   April Smitty Cords, MD  HYDROcodone-acetaminophen (NORCO/VICODIN) 5-325 MG per tablet Take 2 tablets by mouth every 4 (four) hours as needed for pain. 07/23/12   Elson Areas, PA-C   BP 126/89  Pulse 86  Temp(Src) 98.9 F (37.2 C) (Oral)  Resp 16  Ht 6' (1.829 m)  Wt 165 lb (74.844 kg)  BMI 22.37 kg/m2  SpO2 100% Physical Exam  Nursing note and vitals reviewed. Constitutional: He is oriented to person, place, and time. He appears well-developed and well-nourished. No distress.  HENT:  Head: Normocephalic and atraumatic.  Mouth/Throat: Oropharynx is clear and moist.  There is frontal and maxillofacial sinus tenderness to palpation.  Neck: Normal range of motion. Neck supple.  Cardiovascular: Normal rate, regular rhythm and normal heart sounds.   Pulmonary/Chest: Effort normal and breath sounds normal. No respiratory distress. He has no wheezes.  Musculoskeletal: Normal range of motion. He exhibits no edema.  Lymphadenopathy:    He has no cervical adenopathy.  Neurological: He is alert and oriented to person, place, and time.  Skin: Skin is warm and dry. He is not diaphoretic.    ED Course  Procedures (including critical care time) Labs Review Labs Reviewed - No data to display  Imaging Review No  results found.   EKG Interpretation None      MDM   Final diagnoses:  None    Will treat with zmax and im decadron.    Geoffery Lyonsouglas Particia Strahm, MD 02/17/14 [phone]

## 2014-02-17 NOTE — ED Notes (Signed)
Developed cough and cold symptoms Friday, seems worse today.

## 2014-02-17 NOTE — Discharge Instructions (Signed)

## 2014-07-21 ENCOUNTER — Encounter (HOSPITAL_BASED_OUTPATIENT_CLINIC_OR_DEPARTMENT_OTHER): Payer: Self-pay | Admitting: Emergency Medicine

## 2014-07-21 ENCOUNTER — Emergency Department (HOSPITAL_BASED_OUTPATIENT_CLINIC_OR_DEPARTMENT_OTHER): Payer: Self-pay

## 2014-07-21 ENCOUNTER — Emergency Department (HOSPITAL_BASED_OUTPATIENT_CLINIC_OR_DEPARTMENT_OTHER)
Admission: EM | Admit: 2014-07-21 | Discharge: 2014-07-21 | Disposition: A | Discharge status: Home / Self Care | Payer: Self-pay | Attending: Emergency Medicine | Admitting: Emergency Medicine

## 2014-07-21 DIAGNOSIS — Z792 Long term (current) use of antibiotics: Secondary | ICD-10-CM | POA: Insufficient documentation

## 2014-07-21 DIAGNOSIS — Z720 Tobacco use: Secondary | ICD-10-CM | POA: Insufficient documentation

## 2014-07-21 DIAGNOSIS — F419 Anxiety disorder, unspecified: Secondary | ICD-10-CM | POA: Insufficient documentation

## 2014-07-21 DIAGNOSIS — Y9289 Other specified places as the place of occurrence of the external cause: Secondary | ICD-10-CM | POA: Insufficient documentation

## 2014-07-21 DIAGNOSIS — S0083XA Contusion of other part of head, initial encounter: Secondary | ICD-10-CM | POA: Insufficient documentation

## 2014-07-21 DIAGNOSIS — W500XXA Accidental hit or strike by another person, initial encounter: Secondary | ICD-10-CM | POA: Insufficient documentation

## 2014-07-21 DIAGNOSIS — Z79899 Other long term (current) drug therapy: Secondary | ICD-10-CM | POA: Insufficient documentation

## 2014-07-21 DIAGNOSIS — S0093XA Contusion of unspecified part of head, initial encounter: Secondary | ICD-10-CM

## 2014-07-21 DIAGNOSIS — Y998 Other external cause status: Secondary | ICD-10-CM | POA: Insufficient documentation

## 2014-07-21 DIAGNOSIS — Z8709 Personal history of other diseases of the respiratory system: Secondary | ICD-10-CM | POA: Insufficient documentation

## 2014-07-21 DIAGNOSIS — S60222A Contusion of left hand, initial encounter: Secondary | ICD-10-CM | POA: Insufficient documentation

## 2014-07-21 DIAGNOSIS — Y9389 Activity, other specified: Secondary | ICD-10-CM | POA: Insufficient documentation

## 2014-07-21 MED ORDER — IBUPROFEN 800 MG PO TABS: 800.0000 mg | ORAL_TABLET | Freq: Three times a day (TID) | ORAL | Status: DC

## 2014-07-21 MED ORDER — HYDROCODONE-ACETAMINOPHEN 5-325 MG PO TABS: 1.0000 | ORAL_TABLET | Freq: Four times a day (QID) | ORAL | Status: DC | PRN

## 2014-07-21 MED ORDER — IBUPROFEN 800 MG PO TABS
800.0000 mg | ORAL_TABLET | Freq: Once | ORAL | Status: AC
  Administered 2014-07-21: 800 mg via ORAL
  Filled 2014-07-21: qty 1
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
## 2014-07-21 NOTE — Discharge Instructions (Signed)
Call and hand specialist for further evaluation of your hand injury. Ice your hand 3-4 times a day elevate above head to reduce swelling. Keep the wound on her forehead clean and dry. Call for a follow up appointment with a Family or Primary Care Provider.  Return if Symptoms worsen.   Take medication as prescribed.

## 2014-08-06 ENCOUNTER — Emergency Department (HOSPITAL_BASED_OUTPATIENT_CLINIC_OR_DEPARTMENT_OTHER)
Admission: EM | Admit: 2014-08-06 | Discharge: 2014-08-06 | Disposition: A | Discharge status: Home / Self Care | Payer: Self-pay | Attending: Emergency Medicine | Admitting: Emergency Medicine

## 2014-08-06 ENCOUNTER — Encounter (HOSPITAL_BASED_OUTPATIENT_CLINIC_OR_DEPARTMENT_OTHER): Payer: Self-pay | Admitting: Emergency Medicine

## 2014-08-06 DIAGNOSIS — J0110 Acute frontal sinusitis, unspecified: Secondary | ICD-10-CM | POA: Insufficient documentation

## 2014-08-06 DIAGNOSIS — J019 Acute sinusitis, unspecified: Secondary | ICD-10-CM

## 2014-08-06 DIAGNOSIS — Z720 Tobacco use: Secondary | ICD-10-CM | POA: Insufficient documentation

## 2014-08-06 DIAGNOSIS — F419 Anxiety disorder, unspecified: Secondary | ICD-10-CM | POA: Insufficient documentation

## 2014-08-06 DIAGNOSIS — Z792 Long term (current) use of antibiotics: Secondary | ICD-10-CM | POA: Insufficient documentation

## 2014-08-06 DIAGNOSIS — B9689 Other specified bacterial agents as the cause of diseases classified elsewhere: Secondary | ICD-10-CM

## 2014-08-06 MED ORDER — DEXAMETHASONE SODIUM PHOSPHATE 10 MG/ML IJ SOLN
10.0000 mg | Freq: Once | INTRAMUSCULAR | Status: AC
  Administered 2014-08-06: 10 mg via INTRAMUSCULAR
  Filled 2014-08-06: qty 1

## 2014-08-06 MED ORDER — AMOXICILLIN-POT CLAVULANATE 875-125 MG PO TABS: 1.0000 | ORAL_TABLET | Freq: Two times a day (BID) | ORAL | Status: DC

## 2014-08-06 NOTE — ED Provider Notes (Signed)
CSN: [id number]     Arrival date & time 08/06/14  1609 History   First MD Initiated Contact with Patient 08/06/14 1621     Chief Complaint  Patient presents with  . Nasal Congestion     (Consider location/radiation/quality/duration/timing/severity/associated sxs/prior Treatment) HPI  Frank Brady is a 39 y.o. male without significant past medical history presenting with 10 day history of bilateral sinus congestion that is worse with leaning over. Congestion is described as a pressure. Patient states he hasn't taken anything for his symptoms. Endorses rhinorrhea that is thin. He denies any fevers, chills, nausea, vomiting, sore throat. He does have dry cough. Patient states he has had recurrent sinus infections. He has never seen an ENT physician.    Past Medical History  Diagnosis Date  . Anxiety   . Sinus pressure    Past Surgical History  Procedure Laterality Date  . Knee surgery    . Finger surgery     No family history on file. History  Substance Use Topics  . Smoking status: Current Some Day Smoker    Types: Cigarettes    Last Attempt to Quit: 06/17/2013  . Smokeless tobacco: Not on file  . Alcohol Use: Yes     Comment: 1-2 x month    Review of Systems  Constitutional: Negative for fever and chills.  HENT: Positive for congestion, rhinorrhea and sinus pressure. Negative for sore throat.   Eyes: Negative for visual disturbance.  Respiratory: Negative for cough.   Gastrointestinal: Negative for nausea and vomiting.      Allergies  Review of patient's allergies indicates no known allergies.  Home Medications   Prior to Admission medications   Medication Sig Start Date End Date Taking? Authorizing Provider  ALPRAZOLAM PO Take 1 mg by mouth 3 (three) times daily.     Historical Provider, MD  amoxicillin (AMOXIL) 500 MG capsule Take 1 capsule (500 mg total) by mouth 3 (three) times daily. 03/05/13   Elson Areas, PA-C  amoxicillin-clavulanate (AUGMENTIN) 875-125  MG per tablet Take 1 tablet by mouth every 12 (twelve) hours. 08/06/14   Louann Sjogren, PA-C  azithromycin (ZITHROMAX Z-PAK) 250 MG tablet 2 po day one, then 1 daily x 4 days 02/17/14   Geoffery Lyons, MD  clindamycin (CLEOCIN) 300 MG capsule Take 1 capsule (300 mg total) by mouth 4 (four) times daily. X 7 days 01/27/14   April K Palumbo-Rasch, MD  HYDROcodone-acetaminophen (NORCO/VICODIN) 5-325 MG per tablet Take 1 tablet by mouth every 6 (six) hours as needed for moderate pain or severe pain. 07/21/14   Mellody Drown, PA-C  ibuprofen (ADVIL,MOTRIN) 800 MG tablet Take 1 tablet (800 mg total) by mouth 3 (three) times daily. 07/21/14   Lauren Parker, PA-C   BP 161/89 mmHg  Pulse 65  Temp(Src) 98.2 F (36.8 C) (Oral)  Resp 16  Ht 6' (1.829 m)  Wt 162 lb (73.483 kg)  BMI 21.97 kg/m2  SpO2 100% Physical Exam  Constitutional: He appears well-developed and well-nourished. No distress.  HENT:  Head: Normocephalic and atraumatic.  Nose: Right sinus exhibits frontal sinus tenderness. Right sinus exhibits no maxillary sinus tenderness. Left sinus exhibits frontal sinus tenderness. Left sinus exhibits no maxillary sinus tenderness.  Mouth/Throat: Mucous membranes are normal. Posterior oropharyngeal edema and posterior oropharyngeal erythema present. No oropharyngeal exudate.  Eyes: Conjunctivae and EOM are normal. Right eye exhibits no discharge. Left eye exhibits no discharge.  Neck: Normal range of motion. Neck supple.  Cardiovascular: Normal rate, regular  rhythm and normal heart sounds.   Pulmonary/Chest: Effort normal and breath sounds normal. No respiratory distress. He has no wheezes. He has no rales.  Abdominal: Soft. Bowel sounds are normal. He exhibits no distension. There is no tenderness.  Lymphadenopathy:    He has cervical adenopathy.  Neurological: He is alert.  Skin: Skin is warm and dry. He is not diaphoretic.  Nursing note and vitals reviewed.   ED Course  Procedures (including  critical care time) Labs Review Labs Reviewed - No data to display  Imaging Review No results found.   EKG Interpretation None      MDM   Final diagnoses:  Acute bacterial sinusitis   Patient complaining of symptoms of sinusitis.    Symptoms have been present for greater than 10 days with nasal discharge and frontal sinus pain.  Concern for acute bacterial rhinosinusitis.  Patient discharged with Augmentin.  Instructions given for warm saline nasal wash and recommendations for follow-up with primary care physician. Due to patients recurrent sinus infection referral to ENT.  Discussed return precautions with patient. Discussed all results and patient verbalizes understanding and agrees with plan.    Louann SjogrenVictoria L Rashod Gougeon, PA-C 08/06/14 2230  Doug SouSam Jacubowitz, MD 08/07/14 [phone]

## 2014-08-06 NOTE — Discharge Instructions (Signed)
Return to the emergency room with worsening of symptoms, new symptoms or with symptoms that are concerning , especially fevers, stiff neck, worsening headache, nausea/vomiting, visual changes or slurred speech, chest pain, shortness of breath, cough with thick colored mucous or blood Drink plenty of fluids with electrolytes especially Gatorade. OTC cold medications such as mucinex, nyquil, dayquil are recommended. Chloraseptic for sore throat. Follow-up with ENT for persistent or recurrent symptoms. Repeat low instructions and follow-up recommendations.   Sinusitis Sinusitis is redness, soreness, and inflammation of the paranasal sinuses. Paranasal sinuses are air pockets within the bones of your face (beneath the eyes, the middle of the forehead, or above the eyes). In healthy paranasal sinuses, mucus is able to drain out, and air is able to circulate through them by way of your nose. However, when your paranasal sinuses are inflamed, mucus and air can become trapped. This can allow bacteria and other germs to grow and cause infection. Sinusitis can develop quickly and last only a short time (acute) or continue over a long period (chronic). Sinusitis that lasts for more than 12 weeks is considered chronic.  CAUSES  Causes of sinusitis include:  Allergies.  Structural abnormalities, such as displacement of the cartilage that separates your nostrils (deviated septum), which can decrease the air flow through your nose and sinuses and affect sinus drainage.  Functional abnormalities, such as when the small hairs (cilia) that line your sinuses and help remove mucus do not work properly or are not present. SIGNS AND SYMPTOMS  Symptoms of acute and chronic sinusitis are the same. The primary symptoms are pain and pressure around the affected sinuses. Other symptoms include:  Upper toothache.  Earache.  Headache.  Bad breath.  Decreased sense of smell and taste.  A cough, which worsens when  you are lying flat.  Fatigue.  Fever.  Thick drainage from your nose, which often is green and may contain pus (purulent).  Swelling and warmth over the affected sinuses. DIAGNOSIS  Your health care provider will perform a physical exam. During the exam, your health care provider may:  Look in your nose for signs of abnormal growths in your nostrils (nasal polyps).  Tap over the affected sinus to check for signs of infection.  View the inside of your sinuses (endoscopy) using an imaging device that has a light attached (endoscope). If your health care provider suspects that you have chronic sinusitis, one or more of the following tests may be recommended:  Allergy tests.  Nasal culture. A sample of mucus is taken from your nose, sent to a lab, and screened for bacteria.  Nasal cytology. A sample of mucus is taken from your nose and examined by your health care provider to determine if your sinusitis is related to an allergy. TREATMENT  Most cases of acute sinusitis are related to a viral infection and will resolve on their own within 10 days. Sometimes medicines are prescribed to help relieve symptoms (pain medicine, decongestants, nasal steroid sprays, or saline sprays).  However, for sinusitis related to a bacterial infection, your health care provider will prescribe antibiotic medicines. These are medicines that will help kill the bacteria causing the infection.  Rarely, sinusitis is caused by a fungal infection. In theses cases, your health care provider will prescribe antifungal medicine. For some cases of chronic sinusitis, surgery is needed. Generally, these are cases in which sinusitis recurs more than 3 times per year, despite other treatments. HOME CARE INSTRUCTIONS   Drink plenty of water. Water helps  thin the mucus so your sinuses can drain more easily.  Use a humidifier.  Inhale steam 3 to 4 times a day (for example, sit in the bathroom with the shower  running).  Apply a warm, moist washcloth to your face 3 to 4 times a day, or as directed by your health care provider.  Use saline nasal sprays to help moisten and clean your sinuses.  Take medicines only as directed by your health care provider.  If you were prescribed either an antibiotic or antifungal medicine, finish it all even if you start to feel better. SEEK IMMEDIATE MEDICAL CARE IF:  You have increasing pain or severe headaches.  You have nausea, vomiting, or drowsiness.  You have swelling around your face.  You have vision problems.  You have a stiff neck.  You have difficulty breathing. MAKE SURE YOU:   Understand these instructions.  Will watch your condition.  Will get help right away if you are not doing well or get worse. Document Released: 07/03/2005 Document Revised: 11/17/2013 Document Reviewed: 07/18/2011 [facility] Patient Information 2015 [place], [place]. This information is not intended to replace advice given to you by your health care provider. Make sure you discuss any questions you have with your health care provider.

## 2014-08-06 NOTE — ED Notes (Signed)
Sinus congestion x10 days.  Sts he could not get in with his pmd and he believes he has "another sinus infection."

## 2014-10-10 ENCOUNTER — Encounter (HOSPITAL_BASED_OUTPATIENT_CLINIC_OR_DEPARTMENT_OTHER): Payer: Self-pay | Admitting: *Deleted

## 2014-10-10 ENCOUNTER — Emergency Department (HOSPITAL_BASED_OUTPATIENT_CLINIC_OR_DEPARTMENT_OTHER): Payer: Self-pay

## 2014-10-10 ENCOUNTER — Emergency Department (HOSPITAL_BASED_OUTPATIENT_CLINIC_OR_DEPARTMENT_OTHER)
Admission: EM | Admit: 2014-10-10 | Discharge: 2014-10-10 | Disposition: A | Discharge status: Home / Self Care | Payer: Self-pay | Attending: Emergency Medicine | Admitting: Emergency Medicine

## 2014-10-10 DIAGNOSIS — Z791 Long term (current) use of non-steroidal anti-inflammatories (NSAID): Secondary | ICD-10-CM | POA: Insufficient documentation

## 2014-10-10 DIAGNOSIS — N50819 Testicular pain, unspecified: Secondary | ICD-10-CM

## 2014-10-10 DIAGNOSIS — Z792 Long term (current) use of antibiotics: Secondary | ICD-10-CM | POA: Insufficient documentation

## 2014-10-10 DIAGNOSIS — R1032 Left lower quadrant pain: Secondary | ICD-10-CM | POA: Insufficient documentation

## 2014-10-10 DIAGNOSIS — Z79899 Other long term (current) drug therapy: Secondary | ICD-10-CM | POA: Insufficient documentation

## 2014-10-10 DIAGNOSIS — R590 Localized enlarged lymph nodes: Secondary | ICD-10-CM | POA: Insufficient documentation

## 2014-10-10 DIAGNOSIS — F419 Anxiety disorder, unspecified: Secondary | ICD-10-CM | POA: Insufficient documentation

## 2014-10-10 DIAGNOSIS — Z720 Tobacco use: Secondary | ICD-10-CM | POA: Insufficient documentation

## 2014-10-10 DIAGNOSIS — E876 Hypokalemia: Secondary | ICD-10-CM | POA: Insufficient documentation

## 2014-10-10 LAB — CBC WITH DIFFERENTIAL/PLATELET
Basophils Absolute: 0 10*3/uL (ref 0.0–0.1)
Basophils Relative: 0 % (ref 0–1)
EOS ABS: 0.1 10*3/uL (ref 0.0–0.7)
EOS PCT: 1 % (ref 0–5)
HCT: 41.1 % (ref 39.0–52.0)
Hemoglobin: 14.3 g/dL (ref 13.0–17.0)
LYMPHS PCT: 29 % (ref 12–46)
Lymphs Abs: 2 10*3/uL (ref 0.7–4.0)
MCH: 30.6 pg (ref 26.0–34.0)
MCHC: 34.8 g/dL (ref 30.0–36.0)
MCV: 87.8 fL (ref 78.0–100.0)
Monocytes Absolute: 0.4 10*3/uL (ref 0.1–1.0)
Monocytes Relative: 6 % (ref 3–12)
Neutro Abs: 4.4 10*3/uL (ref 1.7–7.7)
Neutrophils Relative %: 64 % (ref 43–77)
PLATELETS: 219 10*3/uL (ref 150–400)
RBC: 4.68 MIL/uL (ref 4.22–5.81)
RDW: 12.5 % (ref 11.5–15.5)
WBC: 6.9 10*3/uL (ref 4.0–10.5)

## 2014-10-10 LAB — BASIC METABOLIC PANEL
ANION GAP: 8 (ref 5–15)
BUN: 7 mg/dL (ref 6–23)
CALCIUM: 9.1 mg/dL (ref 8.4–10.5)
CO2: 28 mmol/L (ref 19–32)
Chloride: 103 mmol/L (ref 96–112)
Creatinine, Ser: 0.87 mg/dL (ref 0.50–1.35)
GFR calc Af Amer: >90 mL/min (ref >90)
GLUCOSE: 94 mg/dL (ref 70–99)
POTASSIUM: 3.1 mmol/L — AB (ref 3.5–5.1)
SODIUM: 139 mmol/L (ref 135–145)

## 2014-10-10 LAB — URINALYSIS, ROUTINE W REFLEX MICROSCOPIC
BILIRUBIN URINE: NEGATIVE
Glucose, UA: NEGATIVE mg/dL
Hgb urine dipstick: NEGATIVE
Ketones, ur: NEGATIVE mg/dL
LEUKOCYTES UA: NEGATIVE
NITRITE: NEGATIVE
Protein, ur: NEGATIVE mg/dL
SPECIFIC GRAVITY, URINE: 1.019 (ref 1.005–1.030)
Urobilinogen, UA: 1 mg/dL (ref 0.0–1.0)
pH: 6.5 (ref 5.0–8.0)

## 2014-10-10 LAB — I-STAT CG4 LACTIC ACID, ED: LACTIC ACID, VENOUS: 0.91 mmol/L (ref 0.5–2.0)

## 2014-10-10 MED ORDER — IOHEXOL 300 MG/ML  SOLN
50.0000 mL | Freq: Once | INTRAMUSCULAR | Status: AC | PRN
  Administered 2014-10-10: 50 mL via ORAL

## 2014-10-10 MED ORDER — POTASSIUM CHLORIDE CRYS ER 20 MEQ PO TBCR: 20.0000 meq | EXTENDED_RELEASE_TABLET | Freq: Every day | ORAL | Status: DC

## 2014-10-10 MED ORDER — SODIUM CHLORIDE 0.9 % IV BOLUS (SEPSIS)
1000.0000 mL | Freq: Once | INTRAVENOUS | Status: AC
  Administered 2014-10-10: 1000 mL via INTRAVENOUS

## 2014-10-10 MED ORDER — ONDANSETRON HCL 4 MG/2ML IJ SOLN
4.0000 mg | Freq: Once | INTRAMUSCULAR | Status: AC
  Administered 2014-10-10: 4 mg via INTRAVENOUS
  Filled 2014-10-10: qty 2

## 2014-10-10 MED ORDER — IOHEXOL 300 MG/ML  SOLN
100.0000 mL | Freq: Once | INTRAMUSCULAR | Status: AC | PRN
  Administered 2014-10-10: 100 mL via INTRAVENOUS

## 2014-10-10 MED ORDER — IBUPROFEN 800 MG PO TABS
800.0000 mg | ORAL_TABLET | Freq: Once | ORAL | Status: AC
  Administered 2014-10-10: 800 mg via ORAL
  Filled 2014-10-10: qty 1

## 2014-10-10 MED ORDER — POTASSIUM CHLORIDE CRYS ER 20 MEQ PO TBCR
40.0000 meq | EXTENDED_RELEASE_TABLET | Freq: Once | ORAL | Status: AC
  Administered 2014-10-10: 40 meq via ORAL
  Filled 2014-10-10: qty 2

## 2014-10-10 MED ORDER — MORPHINE SULFATE 4 MG/ML IJ SOLN
4.0000 mg | INTRAMUSCULAR | Status: DC | PRN
  Administered 2014-10-10: 4 mg via INTRAVENOUS
  Filled 2014-10-10: qty 1

## 2014-10-10 NOTE — ED Notes (Signed)
Left side groin pain- states was hurting this morning but worsened 2 hours ago- denies known injury- denies dysuria- left testicular pain also

## 2014-10-10 NOTE — Discharge Instructions (Signed)
For pain control please take ibuprofen (also known as Motrin or Advil) 800mg (this is normally 4 over the counter pills) 3 times a day  for 5 days. Take with food to minimize stomach irritation. ° ° °Please follow with your primary care doctor in the next 2 days for a check-up. They must obtain records for further management.  ° °Do not hesitate to return to the Emergency Department for any new, worsening or concerning symptoms.  ° °

## 2014-10-10 NOTE — ED Provider Notes (Signed)
CSN: [id number]     Arrival date & time 10/10/14  1831 History  This chart was scribed for a non-physician practitioner, Wynetta EmeryNicole Javi Bollman, PA-C working with Tilden FossaElizabeth Rees, MD by [place] Peace, ED Scribe. The patient was seen in MH11/MH11. The patient's care was started at 6:46 PM.      Chief Complaint  Patient presents with  . Groin Pain      Patient is a 39 y.o. male presenting with groin pain. The history is provided by the patient. No language interpreter was used.  Groin Pain This is a new problem. The current episode started 6 to 12 hours ago. The problem occurs constantly. The problem has been gradually worsening.    HPI Comments: Frank Brady is a 39 y.o. male who presents to the Emergency Department complaining of radiating left testicular pain that extends upwards into his groin. He explains that pain started this morning but has gotten progressively worse in the past 2 hours. Rates pain as 9/10 currently. He describes pain as "aching" feeling with intermittent shooting sensations at various times. Pt notes applying pressure to affected area provides some relief. No complaints of penile discharge, penile pain, dysuria, urinary or bladder incontinence, nausea, or vomiting, diarrhea constipation, not passing flatulence.. Pt is current smoker.    Past Medical History  Diagnosis Date  . Anxiety   . Sinus pressure    Past Surgical History  Procedure Laterality Date  . Knee surgery    . Finger surgery     No family history on file. History  Substance Use Topics  . Smoking status: Current Some Day Smoker    Types: Cigarettes    Last Attempt to Quit: 06/17/2013  . Smokeless tobacco: Never Used  . Alcohol Use: Yes     Comment: 1-2 x month    Review of Systems  Gastrointestinal: Negative for nausea, vomiting, diarrhea and constipation.  Genitourinary: Positive for testicular pain. Negative for dysuria, urgency, frequency, hematuria, discharge, difficulty urinating and penile  pain.  All other systems reviewed and are negative.     Allergies  Review of patient's allergies indicates no known allergies.  Home Medications   Prior to Admission medications   Medication Sig Start Date End Date Taking? Authorizing Provider  ALPRAZOLAM PO Take 1 mg by mouth 3 (three) times daily.    Yes Historical Provider, MD  amoxicillin (AMOXIL) 500 MG capsule Take 1 capsule (500 mg total) by mouth 3 (three) times daily. 03/05/13   Elson AreasLeslie K Sofia, PA-C  amoxicillin-clavulanate (AUGMENTIN) 875-125 MG per tablet Take 1 tablet by mouth every 12 (twelve) hours. 08/06/14   Oswaldo ConroyVictoria Creech, PA-C  azithromycin (ZITHROMAX Z-PAK) 250 MG tablet 2 po day one, then 1 daily x 4 days 02/17/14   Geoffery Lyonsouglas Delo, MD  clindamycin (CLEOCIN) 300 MG capsule Take 1 capsule (300 mg total) by mouth 4 (four) times daily. X 7 days 01/27/14   April Palumbo, MD  HYDROcodone-acetaminophen (NORCO/VICODIN) 5-325 MG per tablet Take 1 tablet by mouth every 6 (six) hours as needed for moderate pain or severe pain. 07/21/14   Mellody DrownLauren Parker, PA-C  ibuprofen (ADVIL,MOTRIN) 800 MG tablet Take 1 tablet (800 mg total) by mouth 3 (three) times daily. 07/21/14   Mellody DrownLauren Parker, PA-C  potassium chloride SA (K-DUR,KLOR-CON) 20 MEQ tablet Take 1 tablet (20 mEq total) by mouth daily. 10/10/14   Tayte Mcwherter, PA-C   BP 143/72 mmHg  Pulse 84  Temp(Src) 97.8 F (36.6 C) (Oral)  Resp 18  Ht 6' (  1.829 m)  Wt 160 lb (72.576 kg)  BMI 21.70 kg/m2  SpO2 100% Physical Exam  Constitutional: He is oriented to person, place, and time. He appears well-developed and well-nourished. No distress.  HENT:  Head: Normocephalic and atraumatic.  Eyes: Conjunctivae and EOM are normal.  Neck: Neck supple. No tracheal deviation present.  Cardiovascular: Normal rate, regular rhythm and intact distal pulses.   Pulmonary/Chest: Effort normal and breath sounds normal. No respiratory distress. He has no wheezes. He has no rales. He exhibits no tenderness.   Abdominal: Soft. Bowel sounds are normal. He exhibits no distension and no mass. There is tenderness. There is no rebound and no guarding.  Point tenderness with mild lymphadenopathy in the left inguinal chain. No guarding or rebound. No hernias appreciated.  Genitourinary:  GU exam a chaperoned by technician: No rashes or lesions, mild left-sided testicular tenderness to palpation with no swelling, no urethral discharge, patient is circumcised.  Musculoskeletal: Normal range of motion.  Neurological: He is alert and oriented to person, place, and time.  Skin: Skin is warm and dry.  Psychiatric: He has a normal mood and affect. His behavior is normal.  Nursing note and vitals reviewed.   ED Course  Procedures (including critical care time) Labs Review Labs Reviewed  BASIC METABOLIC PANEL - Abnormal; Notable for the following:    Potassium 3.1 (*)    All other components within normal limits  URINALYSIS, ROUTINE W REFLEX MICROSCOPIC  CBC WITH DIFFERENTIAL/PLATELET  I-STAT CG4 LACTIC ACID, ED  GC/CHLAMYDIA PROBE AMP ([place])    Imaging Review US Scrotum  10/10/2014   CLINICAL DATA:  Patient with left testicular pain for 6 hr, worsening over the past 2 hr. This radiates to the lower inguinal canal. No history of trauma.  EXAM: SCROTAL ULTRASOUND  DOPPLER ULTRASOUND OF THE TESTICLES  TECHNIQUE: Complete ultrasound examination of the testicles, epididymis, and other scrotal structures was performed. Color and spectral Doppler ultrasound were also utilized to evaluate blood flow to the testicles.  COMPARISON:  None.  FINDINGS: Right testicle  Measurements: 4.2 x 1.7 x 3.0 cm. No mass. Multiple echogenic foci are demonstrated.Tubular ectasia of the reti testi.  Left testicle  Measurements: 3.9 x 2.2 x 3.0 cm. No mass. Multiple echogenic foci are demonstrated. Tubular ectasia of the reti testi.  Right epididymis:  Normal in size and appearance.  Left epididymis:  Normal in size and  appearance.  Hydrocele:  None visualized.  Varicocele:  None visualized.  Pulsed Doppler interrogation of both testes demonstrates normal low resistance arterial and venous waveforms bilaterally.  Focal sonographic evaluation at the patient reported site of tenderness within the left inguinal region demonstrates a mildly prominent left inguinal lymph node.  IMPRESSION: Sonographic evaluation at the patient directed site of pain within the left inguinal region demonstrates a mildly prominent left inguinal lymph node.  No evidence for testicular torsion.  Testicular microlithiasis.   Electronically Signed   By: Annia Belt M.D.   On: 10/10/2014 19:46   Ct Abdomen Pelvis W Contrast  10/10/2014   CLINICAL DATA:  39 year old male with left testicular pain radiating upwards into the left groin since this morning.  EXAM: CT ABDOMEN AND PELVIS WITH CONTRAST  TECHNIQUE: Multidetector CT imaging of the abdomen and pelvis was performed using the standard protocol following bolus administration of intravenous contrast.  CONTRAST:  50mL OMNIPAQUE IOHEXOL 300 MG/ML SOLN, OMNIPAQUE IOHEXOL 300 MG/ML SOLN  COMPARISON:  No priors.  FINDINGS: Lower chest:  Unremarkable.  Hepatobiliary: No cystic or solid hepatic lesions. No intra or extrahepatic biliary ductal dilatation. Gallbladder is normal in appearance.  Pancreas: Unremarkable.  Spleen: Unremarkable. Small splenule adjacent to the medial aspect of the spleen incidentally noted.  Adrenals/Urinary Tract: Bilateral adrenal glands and bilateral kidneys are normal in appearance. No hydroureteronephrosis. Urinary bladder is normal in appearance.  Stomach/Bowel: Normal appearance of the stomach. No pathologic dilatation of small bowel or colon.  Vascular/Lymphatic: Minimal atherosclerosis throughout the visualized abdominal and pelvic vasculature. No aneurysm or dissection. Several prominent but nonenlarged lymph nodes are noted in the inguinal regions bilaterally (left  greater than right) measuring up to 8 mm in short axis. No lymphadenopathy is otherwise noted in the abdomen or pelvis.  Reproductive: Prostate gland and seminal vesicles are unremarkable in appearance.  Other: No inguinal hernia. No significant volume of ascites. No pneumoperitoneum.  Musculoskeletal: There are no aggressive appearing lytic or blastic lesions noted in the visualized portions of the skeleton.  IMPRESSION: 1. No acute findings in the abdomen or pelvis to account for the patient's symptoms. 2. Mild atherosclerosis. 3. Incidental findings, as above.   Electronically Signed   By: Trudie Reed M.D.   On: 10/10/2014 21:43   [place] Art/ven Flow Abd Pelv Doppler  10/10/2014   CLINICAL DATA:  Patient with left testicular pain for 6 hr, worsening over the past 2 hr. This radiates to the lower inguinal canal. No history of trauma.  EXAM: SCROTAL ULTRASOUND  DOPPLER ULTRASOUND OF THE TESTICLES  TECHNIQUE: Complete ultrasound examination of the testicles, epididymis, and other scrotal structures was performed. Color and spectral Doppler ultrasound were also utilized to evaluate blood flow to the testicles.  COMPARISON:  None.  FINDINGS: Right testicle  Measurements: 4.2 x 1.7 x 3.0 cm. No mass. Multiple echogenic foci are demonstrated.Tubular ectasia of the reti testi.  Left testicle  Measurements: 3.9 x 2.2 x 3.0 cm. No mass. Multiple echogenic foci are demonstrated. Tubular ectasia of the reti testi.  Right epididymis:  Normal in size and appearance.  Left epididymis:  Normal in size and appearance.  Hydrocele:  None visualized.  Varicocele:  None visualized.  Pulsed Doppler interrogation of both testes demonstrates normal low resistance arterial and venous waveforms bilaterally.  Focal sonographic evaluation at the patient reported site of tenderness within the left inguinal region demonstrates a mildly prominent left inguinal lymph node.  IMPRESSION: Sonographic evaluation at the patient directed site of  pain within the left inguinal region demonstrates a mildly prominent left inguinal lymph node.  No evidence for testicular torsion.  Testicular microlithiasis.   Electronically Signed   By: Annia Belt M.D.   On: 10/10/2014 19:46     EKG Interpretation None     Medications  morphine 4 MG/ML injection 4 mg (4 mg Intravenous Given 10/10/14 1931)  sodium chloride 0.9 % bolus 1,000 mL (0 mLs Intravenous Stopped 10/10/14 2006)  ondansetron (ZOFRAN) injection 4 mg (4 mg Intravenous Given 10/10/14 1928)  iohexol (OMNIPAQUE) 300 MG/ML solution 50 mL (50 mLs Oral Contrast Given 10/10/14 2016)  iohexol (OMNIPAQUE) 300 MG/ML solution 100 mL (100 mLs Intravenous Contrast Given 10/10/14 2123)  potassium chloride SA (K-DUR,KLOR-CON) CR tablet 40 mEq (40 mEq Oral Given 10/10/14 2031)  ibuprofen (ADVIL,MOTRIN) tablet 800 mg (800 mg Oral Given 10/10/14 2126)   6:50 PM- Treatment plan was discussed with patient who verbalizes understanding and agrees.    MDM   Final diagnoses:  Left groin pain  Hypokalemia   Filed Vitals:  10/10/14 1836 10/10/14 1937 10/10/14 2013 10/10/14 2158  BP: 143/72 112/64 131/77 123/64  Pulse: 84 55 52 50  Temp: 97.8 F (36.6 C)     TempSrc: Oral     Resp: Height: 6' (1.829 m)     Weight: 160 lb (72.576 kg)     SpO2: 100% 100% 100% 99%    Medications  morphine 4 MG/ML injection 4 mg (4 mg Intravenous Given 10/10/14 1931)  sodium chloride 0.9 % bolus 1,000 mL (0 mLs Intravenous Stopped 10/10/14 2006)  ondansetron (ZOFRAN) injection 4 mg (4 mg Intravenous Given 10/10/14 1928)  iohexol (OMNIPAQUE) 300 MG/ML solution 50 mL (50 mLs Oral Contrast Given 10/10/14 2016)  iohexol (OMNIPAQUE) 300 MG/ML solution 100 mL (100 mLs Intravenous Contrast Given 10/10/14 2123)  potassium chloride SA (K-DUR,KLOR-CON) CR tablet 40 mEq (40 mEq Oral Given 10/10/14 2031)  ibuprofen (ADVIL,MOTRIN) tablet 800 mg (800 mg Oral Given 10/10/14 2126)    Frank Brady is a pleasant 39 y.o. male  presenting with acute onset of left groin and testicular pain this morning significantly worsening over the last 2 hours. Color flow ultrasound shows no signs of torsion, epididymitis, they do note a lymphadenopathy. Patient's friend has joined him in the ED and she is very concerned about a hernia as patient has had this in the past, never this severe, she feels that he is self reducing it with pressure. CT abdomen pelvis is ordered which shows no acute abnormality, they do note bilateral inguinal lymph nodes but no other lymphadenopathy. Serial abdominal exams remain benign, his potassium is mildly reduced at 3.1. I will replete this and patient will be advised to take Motrin for pain control and advised to have close follow-up with his primary care physician.  Patient and his friend are displeased that the root cause of his pain is not found. I've explained to them that in an emergency room we rule out life threats and he will have to follow with his primary care physician for further workup.   Evaluation does not show pathology that would require ongoing emergent intervention or inpatient treatment. Pt is hemodynamically stable and mentating appropriately. Discussed findings and plan with patient/guardian, who agrees with care plan. All questions answered. Return precautions discussed and outpatient follow up given.   Discharge Medication List as of 10/10/2014  9:58 PM    START taking these medications   Details  potassium chloride SA (K-DUR,KLOR-CON) 20 MEQ tablet Take 1 tablet (20 mEq total) by mouth daily., Starting 10/10/2014, Until Discontinued, Print         I personally performed the services described in this documentation, which was scribed in my presence. The recorded information has been reviewed and is accurate.   Wynetta Emery, PA-C 10/10/14 2249  Tilden Fossa, MD 10/10/14 [phone]

## 2014-10-12 LAB — GC/CHLAMYDIA PROBE AMP (~~LOC~~) NOT AT ARMC
Chlamydia: NEGATIVE
Neisseria Gonorrhea: NEGATIVE

## 2014-12-16 ENCOUNTER — Encounter (HOSPITAL_BASED_OUTPATIENT_CLINIC_OR_DEPARTMENT_OTHER): Payer: Self-pay | Admitting: Emergency Medicine

## 2014-12-16 ENCOUNTER — Emergency Department (HOSPITAL_BASED_OUTPATIENT_CLINIC_OR_DEPARTMENT_OTHER)
Admission: EM | Admit: 2014-12-16 | Discharge: 2014-12-17 | Disposition: A | Discharge status: Home / Self Care | Payer: Self-pay | Attending: Emergency Medicine | Admitting: Emergency Medicine

## 2014-12-16 DIAGNOSIS — Y998 Other external cause status: Secondary | ICD-10-CM | POA: Insufficient documentation

## 2014-12-16 DIAGNOSIS — Z8709 Personal history of other diseases of the respiratory system: Secondary | ICD-10-CM | POA: Insufficient documentation

## 2014-12-16 DIAGNOSIS — Z792 Long term (current) use of antibiotics: Secondary | ICD-10-CM | POA: Insufficient documentation

## 2014-12-16 DIAGNOSIS — Z79899 Other long term (current) drug therapy: Secondary | ICD-10-CM | POA: Insufficient documentation

## 2014-12-16 DIAGNOSIS — S60511A Abrasion of right hand, initial encounter: Secondary | ICD-10-CM | POA: Insufficient documentation

## 2014-12-16 DIAGNOSIS — S0990XA Unspecified injury of head, initial encounter: Secondary | ICD-10-CM

## 2014-12-16 DIAGNOSIS — Y9389 Activity, other specified: Secondary | ICD-10-CM | POA: Insufficient documentation

## 2014-12-16 DIAGNOSIS — S0181XA Laceration without foreign body of other part of head, initial encounter: Secondary | ICD-10-CM | POA: Insufficient documentation

## 2014-12-16 DIAGNOSIS — F419 Anxiety disorder, unspecified: Secondary | ICD-10-CM | POA: Insufficient documentation

## 2014-12-16 DIAGNOSIS — Y9289 Other specified places as the place of occurrence of the external cause: Secondary | ICD-10-CM | POA: Insufficient documentation

## 2014-12-16 DIAGNOSIS — Z720 Tobacco use: Secondary | ICD-10-CM | POA: Insufficient documentation

## 2014-12-16 MED ORDER — IBUPROFEN 800 MG PO TABS
800.0000 mg | ORAL_TABLET | Freq: Once | ORAL | Status: AC
  Administered 2014-12-17: 800 mg via ORAL
  Filled 2014-12-16: qty 1

## 2014-12-16 MED ORDER — HYDROCODONE-ACETAMINOPHEN 5-325 MG PO TABS
1.0000 | ORAL_TABLET | Freq: Once | ORAL | Status: AC
  Administered 2014-12-17: 1 via ORAL
  Filled 2014-12-16: qty 1

## 2014-12-16 MED ORDER — TETANUS-DIPHTH-ACELL PERTUSSIS 5-2.5-18.5 LF-MCG/0.5 IM SUSP: 0.5000 mL | Freq: Once | INTRAMUSCULAR | Status: DC

## 2014-12-16 MED ORDER — LIDOCAINE-EPINEPHRINE 2 %-1:100000 IJ SOLN
20.0000 mL | Freq: Once | INTRAMUSCULAR | Status: DC
  Filled 2014-12-16: qty 1
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
## 2014-12-17 ENCOUNTER — Emergency Department (HOSPITAL_BASED_OUTPATIENT_CLINIC_OR_DEPARTMENT_OTHER): Payer: Self-pay

## 2014-12-17 MED ORDER — HYDROCODONE-ACETAMINOPHEN 5-325 MG PO TABS: 1.0000 | ORAL_TABLET | Freq: Four times a day (QID) | ORAL | Status: DC | PRN

## 2014-12-17 MED ORDER — OXYCODONE-ACETAMINOPHEN 5-325 MG PO TABS: 1.0000 | ORAL_TABLET | Freq: Four times a day (QID) | ORAL | Status: DC | PRN

## 2014-12-17 MED ORDER — AMOXICILLIN-POT CLAVULANATE 875-125 MG PO TABS: 1.0000 | ORAL_TABLET | Freq: Two times a day (BID) | ORAL | Status: DC

## 2014-12-17 NOTE — Discharge Instructions (Signed)
Absorbable Suture Repair Absorbable sutures (stitches) hold skin together so you can heal. Keep skin wounds clean and dry for the next 2 to 3 days. Then, you may gently wash your wound and dress it with an antibiotic ointment as recommended. As your wound begins to heal, the sutures are no longer needed, and they typically begin to fall off. This will take 7 to 10 days. After 10 days, if your sutures are loose, you can remove them by wiping with a clean gauze pad or a cotton ball. Do not pull your sutures out. They should wipe away easily. If after 10 days they do not easily wipe away, have your caregiver take them out. Absorbable sutures may be used deep in a wound to help hold it together. If these stitches are below the skin, the body will absorb them completely in 3 to 4 weeks.  You may need a tetanus shot if:  You cannot remember when you had your last tetanus shot.  You have never had a tetanus shot. If you get a tetanus shot, your arm may swell, get red, and feel warm to the touch. This is common and not a problem. If you need a tetanus shot and you choose not to have one, there is a rare chance of getting tetanus. Sickness from tetanus can be serious. SEEK IMMEDIATE MEDICAL CARE IF:  You have redness in the wound area.  The wound area feels hot to the touch.  You develop swelling in the wound area.  You develop pain.  There is fluid drainage from the wound. Document Released: 08/10/2004 Document Revised: 09/25/2011 Document Reviewed: 11/22/2010 [facility] Patient Information 2015 [place], [place]. This information is not intended to replace advice given to you by your health care provider. Make sure you discuss any questions you have with your health care provider.   Human Bite Human bite wounds tend to become infected, even when they seem minor at first. Bite wounds of the hand can be serious because the tendons and joints are close to the skin. Infection can develop very rapidly, even  in a matter of hours.  DIAGNOSIS  Your caregiver will most likely:  Take a detailed history of the bite injury.  Perform a wound exam.  Take your medical history. Blood tests or X-rays may be performed. Sometimes, infected bite wounds are cultured and sent to a lab to identify the infectious bacteria. TREATMENT  Medical treatment will depend on the location of the bite as well as the patient's medical history. Treatment may include:  Wound care, such as cleaning and flushing the wound with saline solution, bandaging, and elevating the affected area.  Antibiotic medicine.  Tetanus immunization.  Leaving the wound open to heal. This is often done with human bites due to the high risk of infection. However, in certain cases, wound closure with stitches, wound adhesive, skin adhesive strips, or staples may be used. Infected bites that are left untreated may require intravenous (IV) antibiotics and surgical treatment in the hospital. HOME CARE INSTRUCTIONS  Follow your caregiver's instructions for wound care.  Take all medicines as directed.  If your caregiver prescribes antibiotics, take them as directed. Finish them even if you start to feel better.  Follow up with your caregiver for further exams or immunizations as directed. You may need a tetanus shot if:  You cannot remember when you had your last tetanus shot.  You have never had a tetanus shot.  The injury broke your skin. If you get  a tetanus shot, your arm may swell, get red, and feel warm to the touch. This is common and not a problem. If you need a tetanus shot and you choose not to have one, there is a rare chance of getting tetanus. Sickness from tetanus can be serious. SEEK IMMEDIATE MEDICAL CARE IF:  You have increased pain, swelling, or redness around the bite wound.  You have chills.  You have a fever.  You have pus draining from the wound.  You have red streaks on the skin coming from the wound.  You  have pain with movement or trouble moving the injured part.  You are not improving, or you are getting worse.  You have any other questions or concerns. MAKE SURE YOU:  Understand these instructions.  Will watch your condition.  Will get help right away if you are not doing well or get worse. Document Released: 08/10/2004 Document Revised: 09/25/2011 Document Reviewed: 02/22/2011 [facility] Patient Information 2015 [place]. This information is not intended to replace advice given to you by your health care provider. Make sure you discuss any questions you have with your health care provider.

## 2015-05-31 ENCOUNTER — Emergency Department (HOSPITAL_BASED_OUTPATIENT_CLINIC_OR_DEPARTMENT_OTHER)
Admission: EM | Admit: 2015-05-31 | Discharge: 2015-05-31 | Disposition: A | Discharge status: Home / Self Care | Payer: Self-pay | Attending: Emergency Medicine | Admitting: Emergency Medicine

## 2015-05-31 ENCOUNTER — Encounter (HOSPITAL_BASED_OUTPATIENT_CLINIC_OR_DEPARTMENT_OTHER): Payer: Self-pay

## 2015-05-31 DIAGNOSIS — F419 Anxiety disorder, unspecified: Secondary | ICD-10-CM | POA: Insufficient documentation

## 2015-05-31 DIAGNOSIS — Z8709 Personal history of other diseases of the respiratory system: Secondary | ICD-10-CM | POA: Insufficient documentation

## 2015-05-31 DIAGNOSIS — Y9389 Activity, other specified: Secondary | ICD-10-CM | POA: Insufficient documentation

## 2015-05-31 DIAGNOSIS — F17210 Nicotine dependence, cigarettes, uncomplicated: Secondary | ICD-10-CM | POA: Insufficient documentation

## 2015-05-31 DIAGNOSIS — M7021 Olecranon bursitis, right elbow: Secondary | ICD-10-CM | POA: Insufficient documentation

## 2015-05-31 MED ORDER — OMEPRAZOLE 20 MG PO CPDR: 20.0000 mg | DELAYED_RELEASE_CAPSULE | Freq: Every day | ORAL | Status: AC

## 2015-05-31 MED ORDER — NAPROXEN 250 MG PO TABS
250.0000 mg | ORAL_TABLET | Freq: Two times a day (BID) | ORAL | Status: DC
Start: 2015-05-31 — End: 2017-06-10

## 2015-05-31 NOTE — ED Notes (Signed)
Swelling to right elbow x 2 days-denies injury

## 2015-05-31 NOTE — ED Provider Notes (Signed)
CSN: [id number]     Arrival date & time 05/31/15  1737 History   First MD Initiated Contact with Patient 05/31/15 1818     Chief Complaint  Patient presents with  . Joint Swelling   Frank SangerBrian E Leyendecker is a 39 y.o. male who is otherwise healthy presents to the emergency department complaining of pain and swelling to his right elbow for the past 2 days. Patient reports he had significant swelling to his right elbow 2 days ago and this is slowly been improving. He currently complains of 6 out of 10 pain to his right elbow. 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 He denies history of similar problem before. She denies known trauma to his right elbow. He reports he's been using ibuprofen with little relief. The patient denies fevers, numbness, tingling, weakness, trauma, falls, or rashes.  (Consider location/radiation/quality/duration/timing/severity/associated sxs/prior Treatment) HPI  Past Medical History  Diagnosis Date  . Anxiety   . Sinus pressure    Past Surgical History  Procedure Laterality Date  . Knee surgery    . Finger surgery     No family history on file. Social History  Substance Use Topics  . Smoking status: Current Every Day Smoker    Types: Cigarettes  . Smokeless tobacco: Never Used  . Alcohol Use: Yes     Comment: 1-2 x month    Review of Systems  Constitutional: Negative for fever.  Musculoskeletal: Positive for arthralgias. Negative for myalgias.  Skin: Negative for rash and wound.  Neurological: Negative for weakness and numbness.      Allergies  Review of patient's allergies indicates no known allergies.  Home Medications   Prior to Admission medications   Medication Sig Start Date End Date Taking? Authorizing Provider  ALPRAZOLAM PO Take 1 mg by mouth 3 (three) times daily.     Historical Provider, MD  naproxen (NAPROSYN) 250 MG tablet Take 1 tablet (250 mg total) by mouth 2 (two) times daily with a meal. 05/31/15   Everlene FarrierWilliam  Shamarion Coots, PA-C  omeprazole (PRILOSEC) 20 MG capsule Take 1 capsule (20 mg total) by mouth daily. 05/31/15   Everlene FarrierWilliam Mardene Lessig, PA-C   BP 145/102 mmHg  Pulse 77  Temp(Src) 98.4 F (36.9 C) (Oral)  Resp 16  Ht 6' (1.829 m)  Wt 170 lb (77.111 kg)  BMI 23.05 kg/m2  SpO2 99% Physical Exam  Constitutional: He appears well-developed and well-nourished. No distress.  Nontoxic appearing.  HENT:  Head: Normocephalic and atraumatic.  Eyes: Right eye exhibits no discharge. Left eye exhibits no discharge.  Cardiovascular: Normal rate and intact distal pulses.   Bilateral radial pulses are intact. Good capillary refill to his right distal fingertips.  Pulmonary/Chest: Effort normal. No respiratory distress.  Musculoskeletal: Normal range of motion. He exhibits edema and tenderness.  Patient has a mild amount of edema to his right olecranon process. This is mildly tender to palpation. He has good range of motion of his right elbow without tenderness. No open wounds or abrasions noted to his elbow. Not concerning for septic joint. Patient has good range of motion of his right upper extremity.  Neurological: He is alert. Coordination normal.  Sensation is intact to his right upper extremity.  Skin: Skin is warm and dry. No rash noted. He is not diaphoretic. No pallor.  Psychiatric: He has a normal mood and affect. His behavior is normal.  Nursing note and vitals reviewed.   ED Course  Procedures (including critical care  time) Labs Review Labs Reviewed - No data to display  Imaging Review No results found.    EKG Interpretation None      Filed Vitals:   05/31/15 1751  BP: 145/102  Pulse: 77  Temp: 98.4 F (36.9 C)  TempSrc: Oral  Resp: 16  Height: 6' (1.829 m)  Weight: 170 lb (77.111 kg)  SpO2: 99%     MDM   Meds given in ED:  Medications - No data to display  New Prescriptions   NAPROXEN (NAPROSYN) 250 MG TABLET    Take 1 tablet (250 mg total) by mouth 2 (two) times daily  with a meal.   OMEPRAZOLE (PRILOSEC) 20 MG CAPSULE    Take 1 capsule (20 mg total) by mouth daily.    Final diagnoses:  Olecranon bursitis of right elbow   This s a 39 y.o. male who is otherwise healthy presents to the emergency department complaining of pain and swelling to his right elbow for the past 2 days. Patient reports he had significant swelling to his right elbow 2 days ago and this is slowly been improving. He currently complains of 6 out of 10 pain to his right elbow. 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 He denies known trauma to his right elbow. On exam the patient is afebrile nontoxic appearing. He does have mild edema noted to his right olecranon process. This is consistent with an olecranon bursitis. He denies known trauma. He has good range of motion of his right elbow. I'm not concerned for septic joint. As there is no known injury and good ROM I see no need for x-ray at this time. He has no open wounds or abrasions to his right elbow. He shows me a picture of his elbow 2 days ago and the edema is clearly improving. I educated the patient on olecranon bursitis and encouraged him to protect this and avoid reinjuring his right elbow. Will provide with prescriptions for naproxen and omeprazole as patient reports he sometimes has problems with acid reflux. I encouraged him to use ice compresses and protect his right elbow from injury. Patient has a follow-up appointment with his primary care doctor in 1 week. I advised him to keep this appointment. I advised the patient to follow-up with their primary care provider this week. I advised the patient to return to the emergency department with new or worsening symptoms or new concerns. The patient verbalized understanding and agreement with plan.      Everlene Farrier, PA-C 05/31/15 1851  Leta Baptist, MD 06/01/15 [phone]

## 2015-05-31 NOTE — Discharge Instructions (Signed)
Elbow Bursitis  Elbow bursitis is inflammation of the fluid-filled sac (bursa) between the tip of your elbow bone (olecranon) and your skin. Elbow bursitis may also be called olecranon bursitis.  Normally, the olecranon bursa has only a small amount of fluid in it to cushion and protect your elbow bone. Elbow bursitis causes fluid to build up inside the bursa. Over time, this swelling and inflammation can cause pain when you bend or lean on your elbow.   CAUSES  Elbow bursitis may be caused by:    Elbow injury (acute trauma).   Leaning on hard surfaces for long periods of time.   Infection from an injury that breaks the skin near your elbow.   A bone growth (spur) that forms at the tip of your elbow.   A medical condition that causes inflammation in your body, such as gout or rheumatoid arthritis.   The cause may also be unknown.   SIGNS AND SYMPTOMS   The first sign of elbow bursitis is usually swelling over the tip of your elbow. This can grow to be the size of a golf ball. This may start suddenly or develop gradually. You may also have:   Pain when bending or leaning on your elbow.   Restricted movement of your elbow.   If your bursitis is caused by an infection, symptoms may also include:   Redness, warmth, and tenderness of the elbow.   Drainage of pus from the swollen area over your elbow, if the skin breaks open.  DIAGNOSIS   Your health care provider may be able to diagnose elbow bursitis based on your signs and symptoms, especially if you have recently been injured. Your health care provider will also do a physical exam. This may include:   X-rays to look for a bone spur or a bone fracture.   Draining fluid from the bursa to test it for infection.   Blood tests to rule out gout or rheumatoid arthritis.  TREATMENT   Treatment for elbow bursitis depends on the cause. Treatment may include:   Medicines. These may include:    Over-the-counter medicines to relieve pain and inflammation.     Antibiotic medicines to fight infection.    Injections of anti-inflammatory medicines (steroids).   Wrapping your elbow with a bandage.   Draining fluid from the bursa.   Wearing elbow pads.   If your bursitis does not get better with treatment, surgery may be needed to remove the bursa.   HOME CARE INSTRUCTIONS    Take medicines only as directed by your health care provider.   If you were prescribed an antibiotic medicine, finish all of it even if you start to feel better.   If your bursitis is caused by an injury, rest your elbow and wear your bandage as directed by your health care provider. You may alsoapply ice to the injured area as directed by your health care provider:   Put ice in a plastic bag.   Place a towel between your skin and the bag.   Leave the ice on for 20 minutes, 2-3 times per day.   Avoid any activities that cause elbow pain.   Use elbow pads or elbow wraps to cushion your elbow.  SEEK MEDICAL CARE IF:   You have a fever.    Your symptoms do not get better with treatment.   Your pain or swelling gets worse.   Your elbow pain or swelling goes away and then returns.   You have   drainage of pus from the swollen area over your elbow.     This information is not intended to replace advice given to you by your health care provider. Make sure you discuss any questions you have with your health care provider.     Document Released: 08/02/2006 Document Revised: 07/24/2014 Document Reviewed: 03/11/2014  Elsevier Interactive Patient Education 2016 [employer]

## 2016-02-04 ENCOUNTER — Encounter (INDEPENDENT_AMBULATORY_CARE_PROVIDER_SITE_OTHER): Payer: Self-pay

## 2016-02-04 DIAGNOSIS — S40869A Insect bite (nonvenomous) of unspecified upper arm, initial encounter: Secondary | ICD-10-CM

## 2016-02-04 DIAGNOSIS — J302 Other seasonal allergic rhinitis: Secondary | ICD-10-CM

## 2016-02-04 DIAGNOSIS — J0100 Acute maxillary sinusitis, unspecified: Secondary | ICD-10-CM

## 2016-08-11 ENCOUNTER — Ambulatory Visit (HOSPITAL_COMMUNITY)
Admission: EM | Admit: 2016-08-11 | Discharge: 2016-08-11 | Disposition: A | Discharge status: Home / Self Care | Payer: BLUE CROSS/BLUE SHIELD | Attending: Family Medicine | Admitting: Family Medicine

## 2016-08-11 ENCOUNTER — Encounter (HOSPITAL_COMMUNITY): Payer: Self-pay | Admitting: Emergency Medicine

## 2016-08-11 ENCOUNTER — Ambulatory Visit (INDEPENDENT_AMBULATORY_CARE_PROVIDER_SITE_OTHER): Payer: BLUE CROSS/BLUE SHIELD

## 2016-08-11 DIAGNOSIS — S6992XA Unspecified injury of left wrist, hand and finger(s), initial encounter: Secondary | ICD-10-CM

## 2016-08-11 MED ORDER — DICLOFENAC SODIUM 75 MG PO TBEC: 75.0000 mg | DELAYED_RELEASE_TABLET | Freq: Two times a day (BID) | ORAL | 0 refills | Status: DC

## 2016-08-11 NOTE — Discharge Instructions (Signed)
There were no obvious fractures or dislocations on your Xray. I have sent a prescription to your pharmacy for a medicine to reduce inflammation called Diclofenac, take 1 tablet twice a day. Should your pain not improve, I would recommend following up with an orthopedic doctor, I have provided the name of Dr. Amanda PeaGramig.

## 2016-08-11 NOTE — ED Provider Notes (Signed)
CSN: [id number]     Arrival date & time 08/11/16  1854 History   None    Chief Complaint  Patient presents with  . Hand Injury   (Consider location/radiation/quality/duration/timing/severity/associated sxs/prior Treatment) 41 year old male presents with left hand pain. He reports he lifted a heavy box, estimated at 60 pounds, and felt a snap in his hand. Has had pain and swelling that has been worsening.   The history is provided by the patient.  Hand Injury  Location:  Hand Hand location:  L hand Injury: yes   Time since incident:  6 hours Pain details:    Quality:  Burning, aching and throbbing   Radiates to:  Does not radiate   Severity:  Severe   Onset quality:  Sudden   Timing:  Constant   Progression:  Worsening Dislocation: no   Foreign body present:  No foreign bodies Tetanus status:  Unknown Prior injury to area:  No Relieved by:  None tried Worsened by:  Movement Ineffective treatments:  None tried   Past Medical History:  Diagnosis Date  . Anxiety   . Sinus pressure    Past Surgical History:  Procedure Laterality Date  . FINGER SURGERY    . KNEE SURGERY     History reviewed. No pertinent family history. Social History  Substance Use Topics  . Smoking status: Former Smoker    Types: Cigarettes    Quit date: 03/11/2016  . Smokeless tobacco: Never Used  . Alcohol use Yes     Comment: 1-2 x month    Review of Systems  Reason unable to perform ROS: as covered in HPI.  All other systems reviewed and are negative.   Allergies  Patient has no known allergies.  Home Medications   Prior to Admission medications   Medication Sig Start Date End Date Taking? Authorizing Provider  ALPRAZOLAM PO Take 1 mg by mouth 3 (three) times daily.    Yes Historical Provider, MD  diclofenac (VOLTAREN) 75 MG EC tablet Take 1 tablet (75 mg total) by mouth 2 (two) times daily. 08/11/16   Dorena Bodo, NP  naproxen (NAPROSYN) 250 MG tablet Take 1 tablet (250 mg  total) by mouth 2 (two) times daily with a meal. 05/31/15   Everlene Farrier, PA-C  omeprazole (PRILOSEC) 20 MG capsule Take 1 capsule (20 mg total) by mouth daily. 05/31/15   Everlene Farrier, PA-C   Meds Ordered and Administered this Visit  Medications - No data to display  BP 138/92 (BP Location: Right Arm)   Pulse (!) 122   Temp 98 F (36.7 C) (Oral)   SpO2 98%  No data found.   Physical Exam  Constitutional: He is oriented to person, place, and time. He appears well-developed and well-nourished. No distress.  Musculoskeletal: He exhibits tenderness.       Left hand: He exhibits decreased range of motion, tenderness and swelling. He exhibits no bony tenderness, normal capillary refill and no deformity. Normal sensation noted. Normal strength noted.       Hands: Neurological: He is alert and oriented to person, place, and time.  Skin: Skin is warm and dry. Capillary refill takes less than 2 seconds. He is not diaphoretic.  Psychiatric: He has a normal mood and affect.  Nursing note and vitals reviewed.   Urgent Care Course     Procedures (including critical care time)  Labs Review Labs Reviewed - No data to display  Imaging Review Dg Hand Complete Left  Result Date:  08/11/2016 CLINICAL DATA:  Picked up 65 pound box at home. Heard snap at the left hand, with left hand pain and swelling. Initial encounter. EXAM: LEFT HAND - COMPLETE 3+ VIEW COMPARISON:  Left hand radiograph from 07/21/2014 FINDINGS: There is no evidence of fracture or dislocation. The joint spaces are preserved. The carpal rows are intact, and demonstrate normal alignment. A chronic mildly displaced ulnar styloid fragment is noted. Diffuse dorsal soft tissue swelling is noted at the hand. IMPRESSION: No evidence of fracture or dislocation. Diffuse dorsal soft tissue swelling noted at the hand. Electronically Signed   By: Roanna RaiderJeffery  Chang M.D.   On: 08/11/2016 20:19     Visual Acuity Review  Right Eye Distance:     Left Eye Distance:   Bilateral Distance:    Right Eye Near:   Left Eye Near:    Bilateral Near:         MDM   1. Injury of left hand, initial encounter   There were no obvious fractures or dislocations on your Xray. I have sent a prescription to your pharmacy for a medicine to reduce inflammation called Diclofenac, take 1 tablet twice a day. Should your pain not improve, I would recommend following up with an orthopedic doctor, I have provided the name of Dr. Amanda PeaGramig.      Dorena BodoLawrence Cameka Rae, NP 08/11/16 2120

## 2016-08-11 NOTE — ED Notes (Signed)
Jones wrap applied to the left hand using cotton web roll and a 3" ace wrap by Penni BombardMara Burchell, RTR.

## 2016-08-11 NOTE — ED Triage Notes (Signed)
Pt was lifting an approximately 60lb box when he heard and felt a "snap" in his left hand.  He had a sharp pain and the hand began to swell.  Pt has limited ROM.

## 2017-06-10 ENCOUNTER — Encounter (HOSPITAL_BASED_OUTPATIENT_CLINIC_OR_DEPARTMENT_OTHER): Payer: Self-pay | Admitting: Emergency Medicine

## 2017-06-10 ENCOUNTER — Other Ambulatory Visit: Payer: Self-pay

## 2017-06-10 ENCOUNTER — Emergency Department (HOSPITAL_BASED_OUTPATIENT_CLINIC_OR_DEPARTMENT_OTHER)
Admission: EM | Admit: 2017-06-10 | Discharge: 2017-06-10 | Disposition: A | Discharge status: Home / Self Care | Payer: BLUE CROSS/BLUE SHIELD | Attending: Emergency Medicine | Admitting: Emergency Medicine

## 2017-06-10 DIAGNOSIS — Z23 Encounter for immunization: Secondary | ICD-10-CM | POA: Insufficient documentation

## 2017-06-10 DIAGNOSIS — Z79899 Other long term (current) drug therapy: Secondary | ICD-10-CM | POA: Diagnosis not present

## 2017-06-10 DIAGNOSIS — Z87891 Personal history of nicotine dependence: Secondary | ICD-10-CM | POA: Insufficient documentation

## 2017-06-10 DIAGNOSIS — L02416 Cutaneous abscess of left lower limb: Secondary | ICD-10-CM | POA: Diagnosis not present

## 2017-06-10 DIAGNOSIS — L03116 Cellulitis of left lower limb: Secondary | ICD-10-CM | POA: Diagnosis not present

## 2017-06-10 DIAGNOSIS — L02415 Cutaneous abscess of right lower limb: Secondary | ICD-10-CM | POA: Insufficient documentation

## 2017-06-10 MED ORDER — TETANUS-DIPHTH-ACELL PERTUSSIS 5-2.5-18.5 LF-MCG/0.5 IM SUSP
0.5000 mL | Freq: Once | INTRAMUSCULAR | Status: AC
  Administered 2017-06-10: 0.5 mL via INTRAMUSCULAR
  Filled 2017-06-10: qty 0.5

## 2017-06-10 MED ORDER — CHLORHEXIDINE GLUCONATE 4 % EX LIQD: Freq: Every day | CUTANEOUS | 0 refills | Status: AC | PRN

## 2017-06-10 MED ORDER — SULFAMETHOXAZOLE-TRIMETHOPRIM 800-160 MG PO TABS: 1.0000 | ORAL_TABLET | Freq: Two times a day (BID) | ORAL | 0 refills | Status: AC

## 2017-06-10 MED ORDER — SULFAMETHOXAZOLE-TRIMETHOPRIM 800-160 MG PO TABS
1.0000 | ORAL_TABLET | Freq: Once | ORAL | Status: AC
  Administered 2017-06-10: 1 via ORAL
  Filled 2017-06-10: qty 1

## 2017-06-10 MED ORDER — NAPROXEN 250 MG PO TABS: 250.0000 mg | ORAL_TABLET | Freq: Two times a day (BID) | ORAL | 0 refills | Status: AC

## 2017-06-10 MED ORDER — LIDOCAINE-EPINEPHRINE (PF) 2 %-1:200000 IJ SOLN
10.0000 mL | Freq: Once | INTRAMUSCULAR | Status: AC
  Administered 2017-06-10: 10 mL
  Filled 2017-06-10: qty 10

## 2017-06-10 NOTE — ED Triage Notes (Signed)
Patient reports 2 "spider bites" on eon his left leg and one on his right leg.

## 2017-06-10 NOTE — ED Provider Notes (Signed)
[facility] EMERGENCY DEPARTMENT Provider Note   CSN: [id number] Arrival date & time: 06/10/17  1728     History   Chief Complaint Chief Complaint  Patient presents with  . Abscess    HPI Frank Brady is a 41 y.o. male.  Frank Brady is a 41 y.o. Male who presents to the ED complaining of an abscess to his right knee. Patient reports he was seen at urgent care 5 days ago for an abscess to his left lower leg.  He had an incision and drainage and was prescribed doxycycline.  He reports he did not start the doxycycline until 2 days ago and has taken 4 pills total.  He tells me that about 2 days ago he developed another abscess to his right knee.  He reports it feels very tight.  He has had no drainage from the area.  He reports his left leg seems to be getting better and appears less red.  He is unsure exactly what this is from.  He did not see any spider bite him.  No known injury to his knee or leg.  He denies fevers, discharge from the area, numbness, tingling, weakness, difficulty moving his leg, or other rashes.    The history is provided by the patient and medical records. No language interpreter was used.  Abscess  Associated symptoms: no fever     Past Medical History:  Diagnosis Date  . Anxiety   . Sinus pressure     There are no active problems to display for this patient.   Past Surgical History:  Procedure Laterality Date  . FINGER SURGERY    . KNEE SURGERY         Home Medications    Prior to Admission medications   Medication Sig Start Date End Date Taking? Authorizing Provider  ALPRAZOLAM PO Take 1 mg by mouth 3 (three) times daily.     [provider]  chlorhexidine (HIBICLENS) 4 % external liquid Apply topically daily as needed. 06/10/17   Everlene Farrieransie, Maguire Sime, PA-C  naproxen (NAPROSYN) 250 MG tablet Take 1 tablet (250 mg total) by mouth 2 (two) times daily with a meal. 06/10/17   Everlene Farrieransie, Haylin Camilli, PA-C  omeprazole (PRILOSEC) 20 MG  capsule Take 1 capsule (20 mg total) by mouth daily. 05/31/15   Everlene Farrieransie, Cythnia Osmun, PA-C  sulfamethoxazole-trimethoprim (BACTRIM DS,SEPTRA DS) 800-160 MG tablet Take 1 tablet by mouth 2 (two) times daily for 7 days. 06/10/17 06/17/17  Everlene Farrieransie, Phill Steck, PA-C    Family History History reviewed. No pertinent family history.  Social History Social History   Tobacco Use  . Smoking status: Former Smoker    Types: Cigarettes    Last attempt to quit: 03/11/2016    Years since quitting: 1.2  . Smokeless tobacco: Never Used  Substance Use Topics  . Alcohol use: Yes    Comment: 1-2 x month  . Drug use: No     Allergies   Patient has no known allergies.   Review of Systems Review of Systems  Constitutional: Negative for chills and fever.  Musculoskeletal: Negative for arthralgias.  Skin: Positive for rash and wound.  Neurological: Negative for weakness and numbness.     Physical Exam Updated Vital Signs BP 113/67 (BP Location: Right Arm)   Pulse (!) 55   Temp 98.3 F (36.8 C) (Oral)   Resp 16   Ht 6' (1.829 m)   Wt 73.9 kg (163 lb)   SpO2 96%   BMI 22.11  kg/m   Physical Exam  Constitutional: He appears well-developed and well-nourished. No distress.  HENT:  Head: Normocephalic and atraumatic.  Eyes: Right eye exhibits no discharge. Left eye exhibits no discharge.  Cardiovascular: Normal rate, regular rhythm and intact distal pulses.  Pulmonary/Chest: Effort normal. No respiratory distress.  Musculoskeletal: Normal range of motion. He exhibits no edema or deformity.  Neurological: He is alert. Coordination normal.  Skin: Skin is warm and dry. Capillary refill takes less than 2 seconds. No rash noted. He is not diaphoretic. There is erythema. No pallor.  There is a 1 cm area of induration noted to his right anterior knee just over his patella. He has good ROM of his knee without difficulty. He also has erythema and induration noted to his left lateral lower leg. No fluctuance  or discharge. No centralized necrosis. No vesicles or bulla.   Psychiatric: He has a normal mood and affect. His behavior is normal.  Nursing note and vitals reviewed.    ED Treatments / Results  Labs (all labs ordered are listed, but only abnormal results are displayed) Labs Reviewed - No data to display  EKG  EKG Interpretation None       Radiology No results found.  Procedures .Marland KitchenIncision and Drainage Date/Time: 06/10/2017 9:23 PM Performed by: Everlene Farrier, PA-C Authorized by: Everlene Farrier, PA-C   Consent:    Consent obtained:  Verbal   Consent given by:  Patient   Risks discussed:  Bleeding, damage to other organs, incomplete drainage, infection and pain   Alternatives discussed:  Delayed treatment and alternative treatment Location:    Type:  Abscess   Size:  2 cm    Location:  Lower extremity   Lower extremity location:  Leg   Leg location:  L lower leg Pre-procedure details:    Skin preparation:  Betadine Anesthesia (see MAR for exact dosages):    Anesthesia method:  Local infiltration   Local anesthetic:  Lidocaine 1% WITH epi Procedure type:    Complexity:  Simple Procedure details:    Incision types:  Single straight   Incision depth:  Dermal   Scalpel blade:  11   Wound management:  Irrigated with saline   Drainage:  Bloody and purulent   Drainage amount:  Scant   Wound treatment:  Wound left open   Packing materials:  None Post-procedure details:    Patient tolerance of procedure:  Tolerated well, no immediate complications   (including critical care time)  EMERGENCY DEPARTMENT US SOFT TISSUE INTERPRETATION "Study: Limited Soft Tissue Ultrasound"  INDICATIONS: Soft tissue infection Multiple views of the body part were obtained in real-time with a multi-frequency linear probe  PERFORMED BY: Myself IMAGES ARCHIVED?: Yes SIDE:Right  BODY PART:Lower extremity INTERPRETATION:  Abcess present and Cellulitis present     Medications  Ordered in ED Medications  lidocaine-EPINEPHrine (XYLOCAINE W/EPI) 2 %-1:200000 (PF) injection 10 mL (not administered)  sulfamethoxazole-trimethoprim (BACTRIM DS,SEPTRA DS) 800-160 MG per tablet 1 tablet (not administered)  Tdap (BOOSTRIX) injection 0.5 mL (0.5 mLs Intramuscular Given 06/10/17 2042)     Initial Impression / Assessment and Plan / ED Course  I have reviewed the triage vital signs and the nursing notes.  Pertinent labs & imaging results that were available during my care of the patient were reviewed by me and considered in my medical decision making (see chart for details).     This is a 41 y.o. Male who presents to the ED complaining of an abscess to his right  knee. Patient reports he was seen at urgent care 5 days ago for an abscess to his left lower leg.  He had an incision and drainage and was prescribed doxycycline.  He reports he did not start the doxycycline until 2 days ago and has taken 4 pills total.  He tells me that about 2 days ago he developed another abscess to his right knee.  He reports it feels very tight.  He has had no drainage from the area.  He reports his left leg seems to be getting better and appears less red.  He is unsure exactly what this is from.  He did not see any spider bite him.  No known injury to his knee or leg.  On exam the patient is afebrile nontoxic-appearing.  He has an area of what appears to be a healing abscess to his right lower leg.  He reports this appears to be doing much better.  No evidence of fluctuance to his right lower leg.  He does have some induration and redness overlying his left knee.  He has good range of motion at his left knee without difficulty.  Ultrasound reveals cellulitis with possible small amount of abscess.  We discussed treatment options with the patient and he agrees with incision and drainage.  Incision and drainage is performed with a scant amount of bloody and purulent material obtained.  It was irrigated with  saline.  His areas of redness was marked with a skin marker.  Tdap was up dated in the ED. Will have him discontinue doxycycline and change to Bactrim as he is completed 2 days and still has an abscess present.  Patient agrees with plan.  I encouraged him to follow-up in the next day or 2 with his PCP for wound recheck.  Return precautions discussed. I advised the patient to follow-up with their primary care provider this week. I advised the patient to return to the emergency department with new or worsening symptoms or new concerns. The patient verbalized understanding and agreement with plan.     Final Clinical Impressions(s) / ED Diagnoses   Final diagnoses:  Cellulitis and abscess of left lower extremity    ED Discharge Orders        Ordered    sulfamethoxazole-trimethoprim (BACTRIM DS,SEPTRA DS) 800-160 MG tablet  2 times daily     06/10/17 2141    naproxen (NAPROSYN) 250 MG tablet  2 times daily with meals     06/10/17 2141    chlorhexidine (HIBICLENS) 4 % external liquid  Daily PRN     06/10/17 2142       Everlene FarrierDansie, Melania Kirks, PA-C 06/10/17 2146    Maia PlanLong, Joshua G, MD 06/11/17 1157

## 2017-06-10 NOTE — ED Notes (Signed)
Pt given d/c instructions as per chart. Rx x 3. Verbalizes understanding. No questions. 

## 2017-06-10 NOTE — ED Notes (Signed)
[facility]. Area is now red, and peeling with large scab to middle. Two other areas seen on L leg. One to L knee that is red, raised, and scabbed in the middle. Another area on shin that is red with intact skin. Pt states R calf area is ~ 2 wks old. Previous skin markings barely visible on R lateral calf. Redness to area exceeds area of markings. EDPA at bedside.
